# Patient Record
Sex: Female | Born: 1965 | Race: White | Hispanic: No | Marital: Married | Smoking: Never smoker
Health system: Southern US, Community
[De-identification: ages and names within clinical notes are randomized; demographics above are authoritative.]

## PROBLEM LIST (undated history)

## (undated) DIAGNOSIS — C439 Malignant melanoma of skin, unspecified: Secondary | ICD-10-CM

## (undated) DIAGNOSIS — G43909 Migraine, unspecified, not intractable, without status migrainosus: Secondary | ICD-10-CM

## (undated) DIAGNOSIS — I10 Essential (primary) hypertension: Secondary | ICD-10-CM

## (undated) DIAGNOSIS — F411 Generalized anxiety disorder: Secondary | ICD-10-CM

## (undated) DIAGNOSIS — T7840XA Allergy, unspecified, initial encounter: Secondary | ICD-10-CM

## (undated) DIAGNOSIS — R112 Nausea with vomiting, unspecified: Secondary | ICD-10-CM

## (undated) DIAGNOSIS — Z9889 Other specified postprocedural states: Secondary | ICD-10-CM

## (undated) HISTORY — PX: WISDOM TOOTH EXTRACTION: SHX21

## (undated) HISTORY — DX: Other specified postprocedural states: R11.2

## (undated) HISTORY — DX: Other specified postprocedural states: Z98.890

## (undated) HISTORY — DX: Generalized anxiety disorder: F41.1

## (undated) HISTORY — DX: Allergy, unspecified, initial encounter: T78.40XA

## (undated) HISTORY — DX: Migraine, unspecified, not intractable, without status migrainosus: G43.909

## (undated) HISTORY — PX: COLONOSCOPY: SHX174

## (undated) HISTORY — DX: Malignant melanoma of skin, unspecified: C43.9

## (undated) HISTORY — DX: Essential (primary) hypertension: I10

---

## 2003-10-10 ENCOUNTER — Ambulatory Visit (HOSPITAL_COMMUNITY): Admission: RE | Admit: 2003-10-10 | Discharge: 2003-10-10 | Payer: Self-pay | Admitting: Internal Medicine

## 2004-02-14 ENCOUNTER — Other Ambulatory Visit: Admission: RE | Admit: 2004-02-14 | Discharge: 2004-02-14 | Payer: Self-pay | Admitting: Obstetrics and Gynecology

## 2005-03-16 ENCOUNTER — Other Ambulatory Visit: Admission: RE | Admit: 2005-03-16 | Discharge: 2005-03-16 | Payer: Self-pay | Admitting: Obstetrics and Gynecology

## 2005-09-03 ENCOUNTER — Ambulatory Visit: Payer: Self-pay | Admitting: Internal Medicine

## 2005-09-10 ENCOUNTER — Ambulatory Visit: Payer: Self-pay | Admitting: Internal Medicine

## 2005-09-23 ENCOUNTER — Ambulatory Visit: Payer: Self-pay

## 2005-11-11 ENCOUNTER — Encounter: Admission: RE | Admit: 2005-11-11 | Discharge: 2005-11-11 | Payer: Self-pay | Admitting: Orthopedic Surgery

## 2005-12-14 LAB — HM MAMMOGRAPHY

## 2006-09-07 ENCOUNTER — Ambulatory Visit: Payer: Self-pay | Admitting: Internal Medicine

## 2006-09-14 ENCOUNTER — Ambulatory Visit: Payer: Self-pay | Admitting: Internal Medicine

## 2006-12-28 IMAGING — US US RENAL
1 series · 14 of 25 positions shown · non-contrast
Comparison: Report of recent MRI.

CLINICAL DATA: Abnormal findings incidentally noted of the kidneys, on MRI of the lumbar spine. 
 RENAL ULTRASOUND:
TECHNIQUE: Complete ultrasound examination of the urinary tract was performed including evaluation of the kidneys, renal collecting systems, and urinary bladder.

[Series 1: us renal · 0.24mm/px · 14 of 42 slices shown]
[im 1/42]
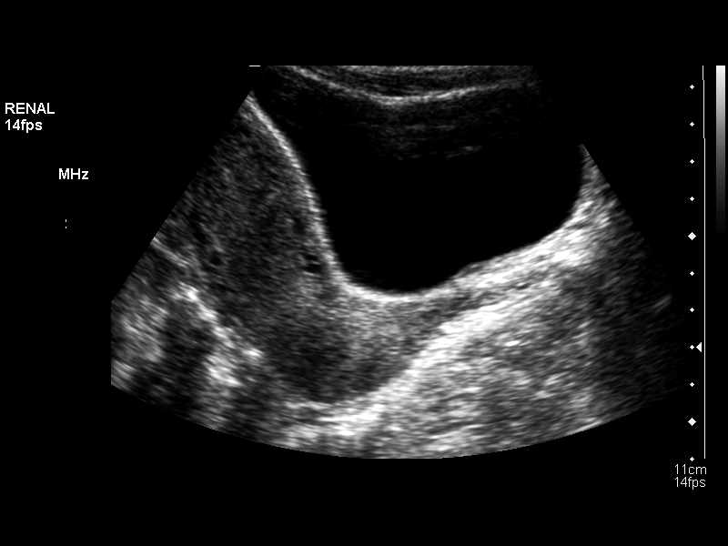
[im 4/42]
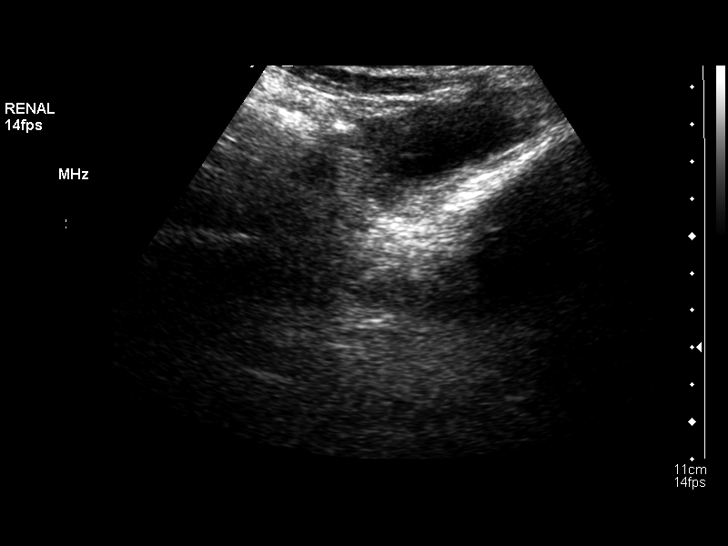
[im 7/42]
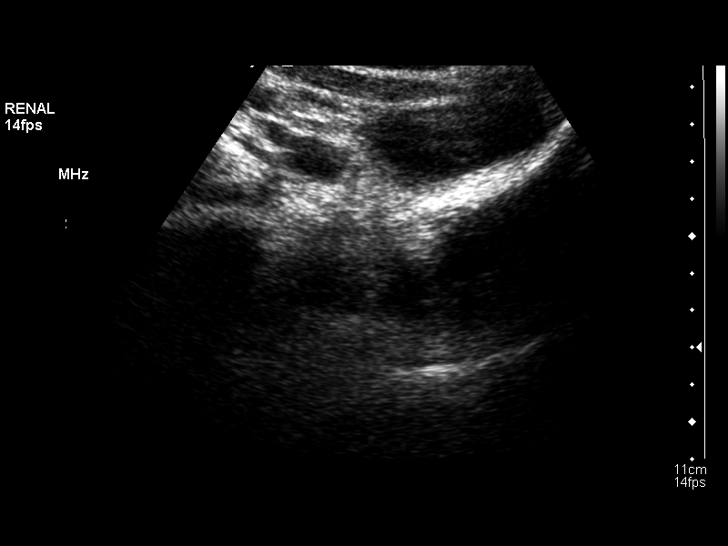
[im 11/42]
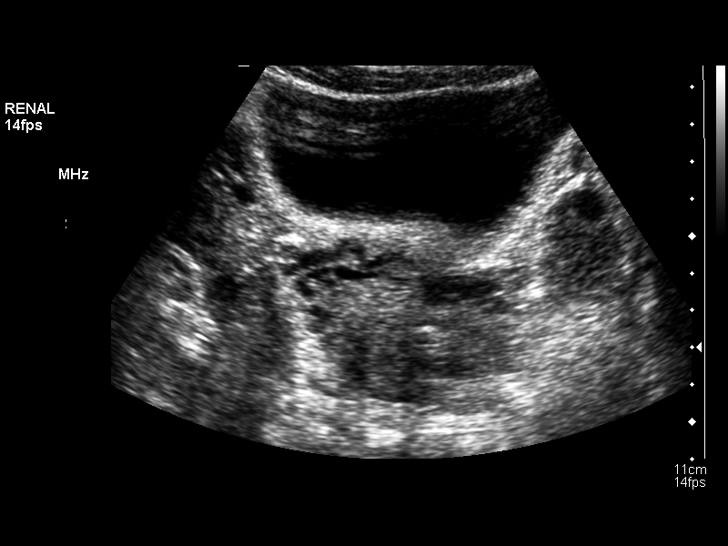
[im 14/42]
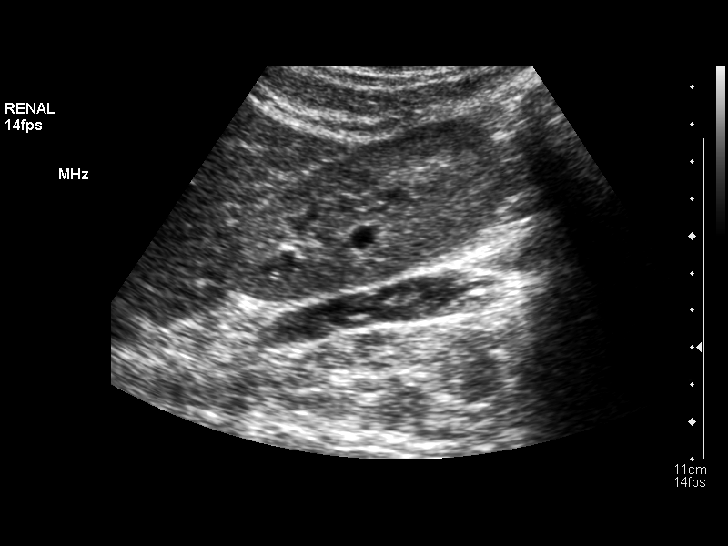
[im 16/42]
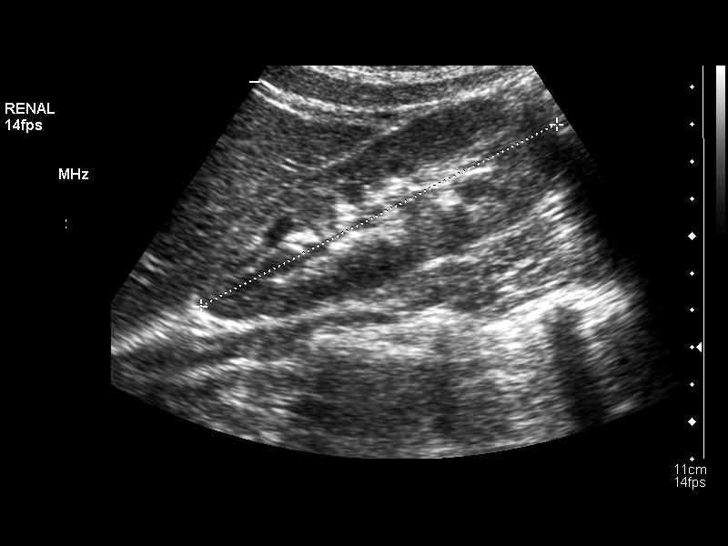
[im 19/42]
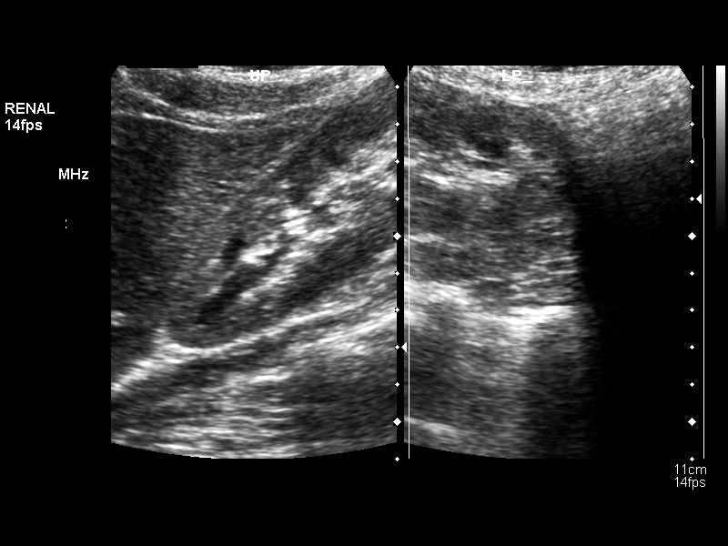
[im 23/42]
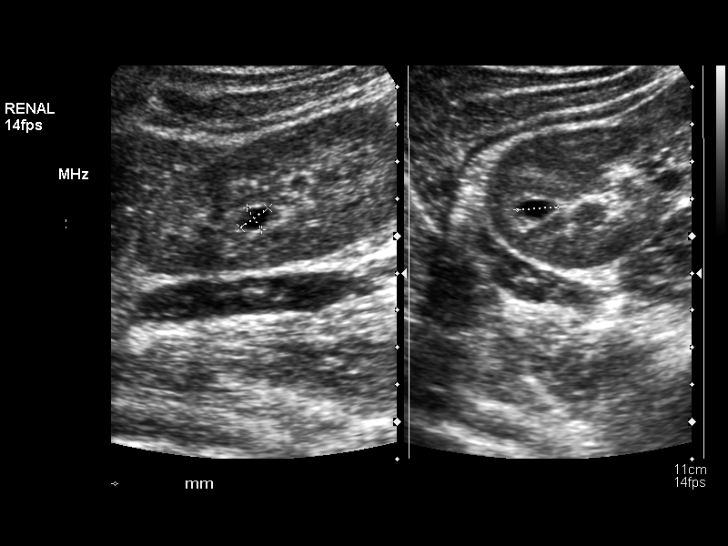
[im 26/42]
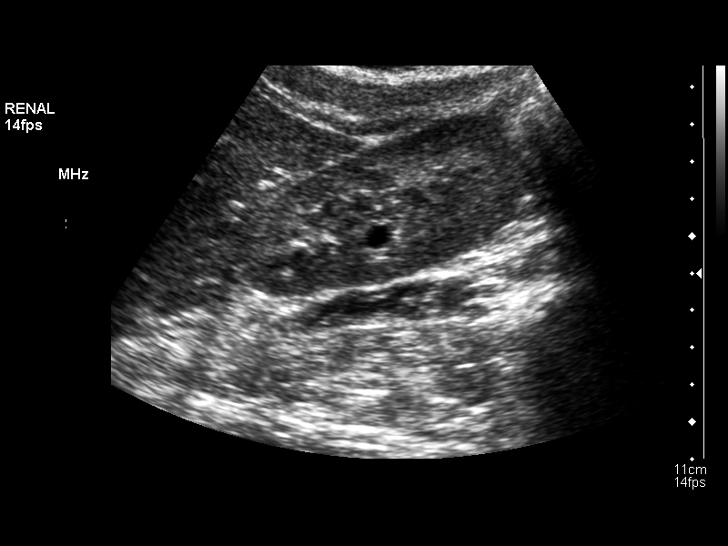
[im 28/42]
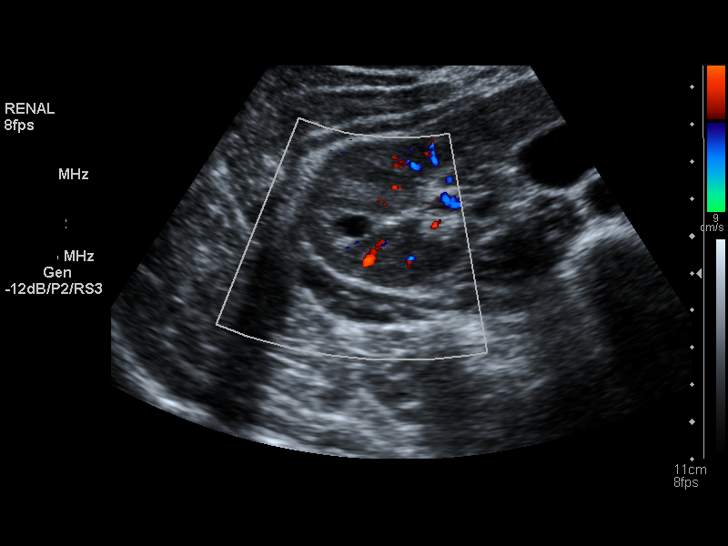
[im 31/42]
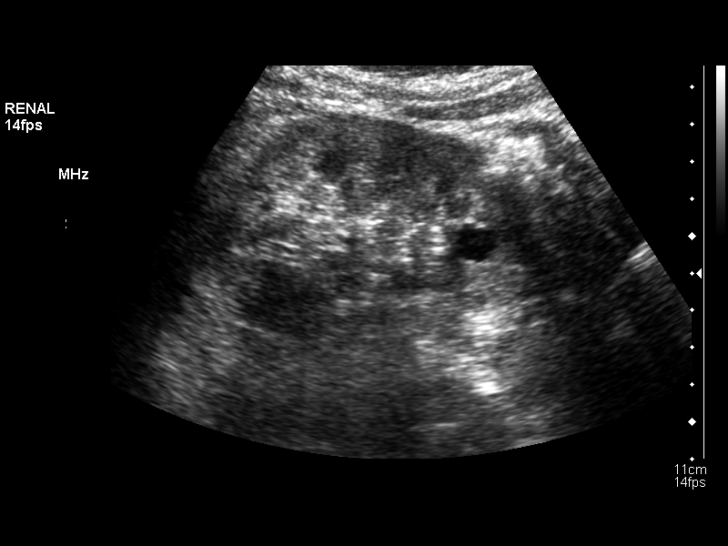
[im 35/42]
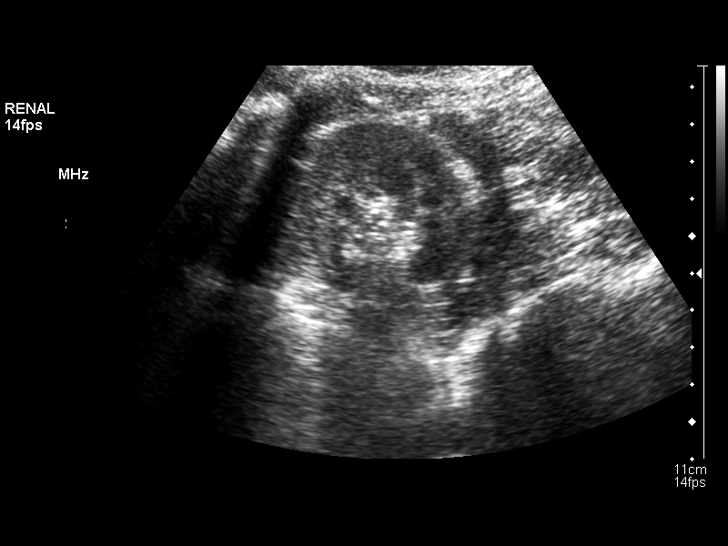
[im 38/42]
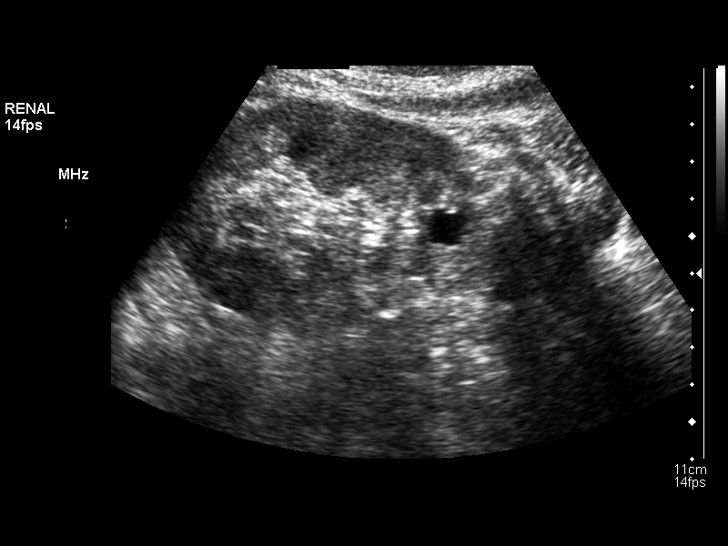
[im 42/42]
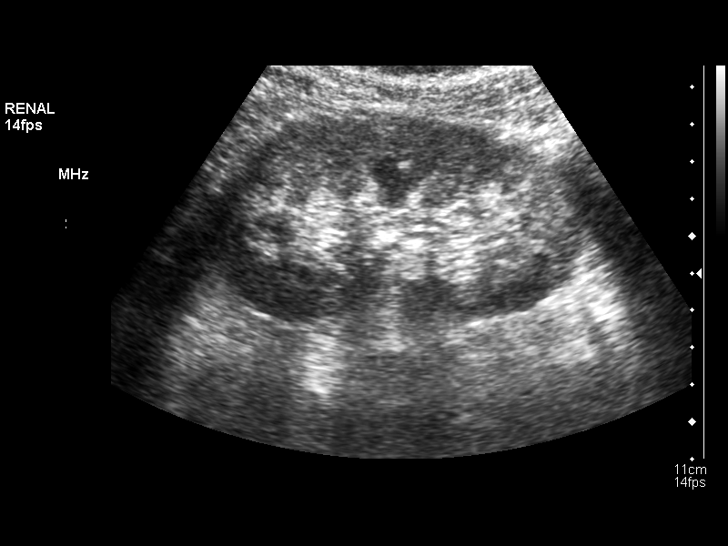

[14 of 25 positions shown; findings below may reference images not displayed]

FINDINGS: The right kidney is 10.8cm and the left 9.9cm.  No hydronephrosis or calculi.  There is an 11mm simple cyst in the lateral mid right kidney.  There is an 11mm simple cyst of the left mid kidney.  No solid masses or complex lesions. 
 The bladder is normal.
IMPRESSION: Small bilateral simple renal cysts ? otherwise normal exam.

## 2007-04-29 ENCOUNTER — Ambulatory Visit: Payer: Self-pay | Admitting: Internal Medicine

## 2007-07-27 DIAGNOSIS — G43909 Migraine, unspecified, not intractable, without status migrainosus: Secondary | ICD-10-CM

## 2007-09-13 ENCOUNTER — Ambulatory Visit: Payer: Self-pay | Admitting: Internal Medicine

## 2007-09-13 LAB — CONVERTED CEMR LAB
ALT: 26 units/L (ref 0–35)
AST: 25 units/L (ref 0–37)
Albumin: 3.9 g/dL (ref 3.5–5.2)
Alkaline Phosphatase: 48 units/L (ref 39–117)
BUN: 13 mg/dL (ref 6–23)
Basophils Absolute: 0 10*3/uL (ref 0.0–0.1)
Basophils Relative: 0.3 % (ref 0.0–1.0)
Bilirubin Urine: NEGATIVE
Bilirubin, Direct: 0.1 mg/dL (ref 0.0–0.3)
Blood in Urine, dipstick: NEGATIVE
CO2: 27 meq/L (ref 19–32)
Calcium: 9.5 mg/dL (ref 8.4–10.5)
Chloride: 103 meq/L (ref 96–112)
Cholesterol: 222 mg/dL (ref 0–200)
Creatinine, Ser: 0.8 mg/dL (ref 0.4–1.2)
Direct LDL: 130.5 mg/dL
Eosinophils Absolute: 0 10*3/uL (ref 0.0–0.6)
Eosinophils Relative: 0.5 % (ref 0.0–5.0)
GFR calc Af Amer: 102 mL/min
GFR calc non Af Amer: 84 mL/min
Glucose, Bld: 84 mg/dL (ref 70–99)
Glucose, Urine, Semiquant: NEGATIVE
HCT: 40 % (ref 36.0–46.0)
HDL: 73.2 mg/dL (ref >39.0)
Hemoglobin: 14 g/dL (ref 12.0–15.0)
Ketones, urine, test strip: NEGATIVE
Lymphocytes Relative: 32.8 % (ref 12.0–46.0)
MCHC: 35.1 g/dL (ref 30.0–36.0)
MCV: 93.7 fL (ref 78.0–100.0)
Monocytes Absolute: 0.3 10*3/uL (ref 0.2–0.7)
Monocytes Relative: 6.5 % (ref 3.0–11.0)
Neutro Abs: 3.1 10*3/uL (ref 1.4–7.7)
Neutrophils Relative %: 59.9 % (ref 43.0–77.0)
Nitrite: NEGATIVE
Platelets: 246 10*3/uL (ref 150–400)
Potassium: 4.6 meq/L (ref 3.5–5.1)
Protein, U semiquant: NEGATIVE
RBC: 4.27 M/uL (ref 3.87–5.11)
RDW: 11.6 % (ref 11.5–14.6)
Sodium: 139 meq/L (ref 135–145)
Specific Gravity, Urine: 1.015
TSH: 1.54 microintl units/mL (ref 0.35–5.50)
Total Bilirubin: 0.8 mg/dL (ref 0.3–1.2)
Total CHOL/HDL Ratio: 3
Total Protein: 6.7 g/dL (ref 6.0–8.3)
Triglycerides: 112 mg/dL (ref 0–149)
Urobilinogen, UA: 0.2
VLDL: 22 mg/dL (ref 0–40)
WBC Urine, dipstick: NEGATIVE
WBC: 5.1 10*3/uL (ref 4.5–10.5)
pH: 7.5

## 2007-09-20 ENCOUNTER — Ambulatory Visit: Payer: Self-pay | Admitting: Internal Medicine

## 2007-10-10 ENCOUNTER — Telehealth: Payer: Self-pay | Admitting: Internal Medicine

## 2007-10-18 ENCOUNTER — Ambulatory Visit: Payer: Self-pay | Admitting: Internal Medicine

## 2007-10-18 DIAGNOSIS — R0989 Other specified symptoms and signs involving the circulatory and respiratory systems: Secondary | ICD-10-CM

## 2007-12-20 ENCOUNTER — Ambulatory Visit: Payer: Self-pay | Admitting: Internal Medicine

## 2007-12-20 DIAGNOSIS — F411 Generalized anxiety disorder: Secondary | ICD-10-CM

## 2007-12-20 HISTORY — DX: Generalized anxiety disorder: F41.1

## 2007-12-21 ENCOUNTER — Ambulatory Visit: Payer: Self-pay | Admitting: Internal Medicine

## 2007-12-27 ENCOUNTER — Telehealth: Payer: Self-pay | Admitting: Internal Medicine

## 2007-12-27 ENCOUNTER — Encounter: Payer: Self-pay | Admitting: Internal Medicine

## 2008-01-03 ENCOUNTER — Ambulatory Visit: Payer: Self-pay | Admitting: Licensed Clinical Social Worker

## 2008-01-10 ENCOUNTER — Ambulatory Visit: Payer: Self-pay | Admitting: Licensed Clinical Social Worker

## 2008-01-18 ENCOUNTER — Ambulatory Visit: Payer: Self-pay | Admitting: Licensed Clinical Social Worker

## 2008-09-25 ENCOUNTER — Telehealth: Payer: Self-pay | Admitting: Internal Medicine

## 2008-09-25 ENCOUNTER — Ambulatory Visit: Payer: Self-pay | Admitting: Internal Medicine

## 2008-10-19 ENCOUNTER — Ambulatory Visit: Payer: Self-pay | Admitting: Internal Medicine

## 2008-11-30 ENCOUNTER — Ambulatory Visit: Payer: Self-pay | Admitting: Internal Medicine

## 2008-11-30 LAB — CONVERTED CEMR LAB
Bilirubin Urine: NEGATIVE
Glucose, Urine, Semiquant: NEGATIVE
Ketones, urine, test strip: NEGATIVE
Nitrite: NEGATIVE
Protein, U semiquant: NEGATIVE
Specific Gravity, Urine: <1.005
Urobilinogen, UA: 0.2
WBC Urine, dipstick: NEGATIVE
pH: 5.5

## 2008-12-01 ENCOUNTER — Encounter: Payer: Self-pay | Admitting: Internal Medicine

## 2008-12-14 DIAGNOSIS — C439 Malignant melanoma of skin, unspecified: Secondary | ICD-10-CM

## 2008-12-14 HISTORY — PX: MELANOMA EXCISION: SHX5266

## 2008-12-14 HISTORY — DX: Malignant melanoma of skin, unspecified: C43.9

## 2009-07-25 ENCOUNTER — Ambulatory Visit: Payer: Self-pay | Admitting: Internal Medicine

## 2009-07-25 LAB — CONVERTED CEMR LAB
ALT: 19 units/L (ref 0–35)
AST: 25 units/L (ref 0–37)
Albumin: 4.6 g/dL (ref 3.5–5.2)
Alkaline Phosphatase: 55 units/L (ref 39–117)
BUN: 17 mg/dL (ref 6–23)
Basophils Absolute: 0 10*3/uL (ref 0.0–0.1)
Basophils Relative: 0.3 % (ref 0.0–3.0)
Bilirubin Urine: NEGATIVE
Bilirubin, Direct: 0.2 mg/dL (ref 0.0–0.3)
CO2: 29 meq/L (ref 19–32)
Calcium: 9.7 mg/dL (ref 8.4–10.5)
Chloride: 104 meq/L (ref 96–112)
Cholesterol: 211 mg/dL — ABNORMAL HIGH (ref 0–200)
Creatinine, Ser: 0.7 mg/dL (ref 0.4–1.2)
Direct LDL: 107.1 mg/dL
Eosinophils Absolute: 0.1 10*3/uL (ref 0.0–0.7)
Eosinophils Relative: 1.4 % (ref 0.0–5.0)
GFR calc non Af Amer: 97.11 mL/min (ref >60)
Glucose, Bld: 81 mg/dL (ref 70–99)
HCT: 46.2 % — ABNORMAL HIGH (ref 36.0–46.0)
HDL: 87.4 mg/dL (ref >39.00)
Hemoglobin, Urine: NEGATIVE
Hemoglobin: 15.5 g/dL — ABNORMAL HIGH (ref 12.0–15.0)
Ketones, ur: NEGATIVE mg/dL
Leukocytes, UA: NEGATIVE
Lymphocytes Relative: 30.7 % (ref 12.0–46.0)
Lymphs Abs: 1.4 10*3/uL (ref 0.7–4.0)
MCHC: 33.5 g/dL (ref 30.0–36.0)
MCV: 95.6 fL (ref 78.0–100.0)
Monocytes Absolute: 0.4 10*3/uL (ref 0.1–1.0)
Monocytes Relative: 9 % (ref 3.0–12.0)
Neutro Abs: 2.7 10*3/uL (ref 1.4–7.7)
Neutrophils Relative %: 58.6 % (ref 43.0–77.0)
Nitrite: NEGATIVE
Platelets: 232 10*3/uL (ref 150.0–400.0)
Potassium: 4.2 meq/L (ref 3.5–5.1)
RBC: 4.84 M/uL (ref 3.87–5.11)
RDW: 12 % (ref 11.5–14.6)
Sodium: 139 meq/L (ref 135–145)
Specific Gravity, Urine: 1.01 (ref 1.000–1.030)
TSH: 1.38 microintl units/mL (ref 0.35–5.50)
Total Bilirubin: 1.1 mg/dL (ref 0.3–1.2)
Total CHOL/HDL Ratio: 2
Total Protein, Urine: NEGATIVE mg/dL
Total Protein: 7.4 g/dL (ref 6.0–8.3)
Triglycerides: 50 mg/dL (ref 0.0–149.0)
Urine Glucose: NEGATIVE mg/dL
Urobilinogen, UA: 0.2 (ref 0.0–1.0)
VLDL: 10 mg/dL (ref 0.0–40.0)
WBC: 4.6 10*3/uL (ref 4.5–10.5)
pH: 6 (ref 5.0–8.0)

## 2009-08-02 ENCOUNTER — Ambulatory Visit: Payer: Self-pay | Admitting: Internal Medicine

## 2009-09-11 ENCOUNTER — Ambulatory Visit: Payer: Self-pay | Admitting: Internal Medicine

## 2010-06-23 ENCOUNTER — Ambulatory Visit: Payer: Self-pay | Admitting: Internal Medicine

## 2010-06-23 DIAGNOSIS — M549 Dorsalgia, unspecified: Secondary | ICD-10-CM | POA: Insufficient documentation

## 2010-09-04 ENCOUNTER — Ambulatory Visit: Payer: Self-pay | Admitting: Internal Medicine

## 2011-01-11 LAB — CONVERTED CEMR LAB
CK-MB: 0.6 ng/mL (ref 0.3–4.0)
TSH: 1.75 microintl units/mL (ref 0.35–5.50)
Total CK: 69 units/L (ref 7–177)

## 2011-01-13 NOTE — Assessment & Plan Note (Signed)
Summary: FLU SHOT // RS  Nurse Visit   Allergies: 1)  ! Celebrex (Celecoxib)  Orders Added: 1)  Admin 1st Vaccine [90471] 2)  Flu Vaccine 16yrs + [04540] Flu Vaccine Consent Questions     Do you have a history of severe allergic reactions to this vaccine? no    Any prior history of allergic reactions to egg and/or gelatin? no    Do you have a sensitivity to the preservative Thimersol? no    Do you have a past history of Guillan-Barre Syndrome? no    Do you currently have an acute febrile illness? no    Have you ever had a severe reaction to latex? no    Vaccine information given and explained to patient? yes    Are you currently pregnant? no    Lot Number:[id number]   Exp Date:06/13/2011   Site Given  Left Deltoid IM .lbflu

## 2011-01-13 NOTE — Assessment & Plan Note (Signed)
Summary: acute back injury/dm   Vital Signs:  Patient profile:   45 year old female Height:      65.5 inches (166.37 cm) Weight:      129.31 pounds (58.78 kg) BMI:     21.27 Temp:     99.0 degrees F (37.22 degrees C) oral Pulse rate:   104 / minute BP sitting:   166 / 90  (left arm) Cuff size:   regular  Vitals Entered By: Josph Macho RMA (June 23, 2010 11:12 AM) CC: Acute Back injury X1 month off and on/ CF, Back pain Is Patient Diabetic? No   CC:  Acute Back injury X1 month off and on/ CF and Back pain.  History of Present Illness:  Back Pain      This is a 45 year old woman who presents with Back pain.  The symptoms began 1 month ago ago.  The patient denies fever, chills, weakness, loss of sensation, fecal incontinence, and urinary incontinence.  The pain is located in the mid low back.  The pain began suddenly.  The pain is made worse by standing or walking and flexion.  The pain is made better by inactivity.  1 month ago pain was intense and sudden---improved until 2 days ago when she had a garage sale----recurrent pain more of a gradual onset---she now describes pain as "tight". no radiation of pain.  All other systems reviewed and were negative   Current Medications (verified): 1)  Meloxicam 7.5 Mg Tabs (Meloxicam) .... Take 1 Tablet By Mouth Once A Day Prn 2)  Imitrex 100 Mg Tabs (Sumatriptan Succinate) .... As Needed  Allergies (verified): 1)  ! Celebrex (Celecoxib)  Physical Exam  Msk:  FROM both hips SLR negative Neurologic:  DTRs lower extremities normal and symmetric   Impression & Recommendations:  Problem # 1:  BACK PAIN (ICD-724.5) trial nsaid muscle relaxer  exercises side effects discussed call if sxs persist Her updated medication list for this problem includes:    Meloxicam 7.5 Mg Tabs (Meloxicam) .[facility]... Take 1 tablet by mouth once a day prn    Cyclobenzaprine Hcl 10 Mg Tabs (Cyclobenzaprine hcl) .Marland Kitchen... 1 by mouth 2 times daily as needed for  back pain  Complete Medication List: 1)  Meloxicam 7.5 Mg Tabs (Meloxicam) .... Take 1 tablet by mouth once a day prn 2)  Imitrex 100 Mg Tabs (Sumatriptan succinate) .... As needed 3)  Cyclobenzaprine Hcl 10 Mg Tabs (Cyclobenzaprine hcl) .Marland Kitchen.. 1 by mouth 2 times daily as needed for back pain Prescriptions: MELOXICAM 7.5 MG TABS (MELOXICAM) Take 1 tablet by mouth once a day prn  #30 Tablet x 0   Entered and Authorized by:   Birdie Sons MD   Signed by:   Birdie Sons MD on 06/23/2010   Method used:   Electronically to        [facility] (retail)       [address]       [place], [place]  [address]       Ph: [phone]       Fax: [phone]   RxID:   [id number] CYCLOBENZAPRINE HCL 10 MG  TABS (CYCLOBENZAPRINE HCL) 1 by mouth 2 times daily as needed for back pain  #30 x 1   Entered and Authorized by:   Birdie Sons MD   Signed by:   Birdie Sons MD on 06/23/2010   Method used:   Electronically to  [facility]       [address]       [place], [place]  [address]       Ph: [phone]       Fax: [phone]   RxID:   [id number]

## 2011-04-01 ENCOUNTER — Telehealth: Payer: Self-pay | Admitting: *Deleted

## 2011-04-01 MED ORDER — HYDROCORTISONE-ACETIC ACID 1-2 % OT SOLN: 3.0000 [drp] | Freq: Three times a day (TID) | OTIC | Status: AC

## 2011-04-01 NOTE — Telephone Encounter (Signed)
See order(s).

## 2011-04-01 NOTE — Telephone Encounter (Signed)
Pt is complaining of one ear canal itching with no rash or pain. Would like ear drops called in to [facility]).

## 2011-04-21 ENCOUNTER — Ambulatory Visit (INDEPENDENT_AMBULATORY_CARE_PROVIDER_SITE_OTHER): Payer: Managed Care, Other (non HMO) | Admitting: Internal Medicine

## 2011-04-21 ENCOUNTER — Encounter: Payer: Self-pay | Admitting: Internal Medicine

## 2011-04-21 VITALS — BP 132/90 | HR 114 | Ht 65.75 in | Wt 122.0 lb

## 2011-04-21 DIAGNOSIS — H9209 Otalgia, unspecified ear: Secondary | ICD-10-CM | POA: Insufficient documentation

## 2011-04-21 MED ORDER — NEOMYCIN-POLYMYXIN-HC 3.5-10000-1 OT SOLN: 3.0000 [drp] | Freq: Three times a day (TID) | OTIC | Status: AC

## 2011-04-21 NOTE — Assessment & Plan Note (Signed)
Given notation of discharge recommend treatment for possible OE. Begin cortisporin otic gtts to left ear. If sx's return after tx consider more likely eczema of canal.

## 2011-04-21 NOTE — Progress Notes (Signed)
  Subjective:    Patient ID: Stacey Burton, female    DOB: 1966-02-17, 45 y.o.   MRN: [id number]  HPI Pt presents to clinic for evaluation of ear discomfort. Notes several week h/o left ear discomfort described as itching. On 4/18 began acetic acid/hydrocortisone combination with subsequent improvement of itching. Did develop temporary discharge described as white and noted transient tinnitus. No injury, trauma, fever or chills. Stopped the medication and the itching sensation has returned. No other alleviating or exacerbating factors. No other complaints.  Reviewed pmh, medications, and allergies.    Review of Systems see HPI     Objective:   Physical Exam  Nursing note and vitals reviewed. Constitutional: She appears well-developed and well-nourished. No distress.  HENT:  Head: Normocephalic and atraumatic.  Right Ear: External ear normal.  Left Ear: External ear normal.  Nose: Nose normal.       Left canal with mild scaling of distal lower canal. No drainage, wound, erythema or swelling noted. TM nl. Right canal partially obscured with cerumen.  Eyes: Conjunctivae are normal. No scleral icterus.  Neurological: She is alert.  Skin: Skin is warm and dry. She is not diaphoretic.          Assessment & Plan:

## 2011-06-30 ENCOUNTER — Ambulatory Visit (INDEPENDENT_AMBULATORY_CARE_PROVIDER_SITE_OTHER): Payer: Managed Care, Other (non HMO) | Admitting: Internal Medicine

## 2011-06-30 ENCOUNTER — Encounter: Payer: Self-pay | Admitting: Internal Medicine

## 2011-06-30 ENCOUNTER — Other Ambulatory Visit: Payer: Self-pay | Admitting: Internal Medicine

## 2011-06-30 ENCOUNTER — Ambulatory Visit: Payer: Managed Care, Other (non HMO) | Admitting: Internal Medicine

## 2011-06-30 VITALS — BP 160/70 | HR 100 | Temp 98.1°F | Wt 122.0 lb

## 2011-06-30 DIAGNOSIS — J029 Acute pharyngitis, unspecified: Secondary | ICD-10-CM

## 2011-06-30 DIAGNOSIS — K122 Cellulitis and abscess of mouth: Secondary | ICD-10-CM

## 2011-06-30 DIAGNOSIS — Z8679 Personal history of other diseases of the circulatory system: Secondary | ICD-10-CM

## 2011-06-30 LAB — POCT RAPID STREP A (OFFICE): Rapid Strep A Screen: NEGATIVE

## 2011-06-30 MED ORDER — AMOXICILLIN-POT CLAVULANATE 875-125 MG PO TABS: 1.0000 | ORAL_TABLET | Freq: Two times a day (BID) | ORAL | Status: AC

## 2011-06-30 NOTE — Patient Instructions (Signed)
Will notify you  of labs/ culture  when available. Treat for bacterial uvulitis. If expect improvement in the next 48 hours or so . Call if worse or cant swallow or high fever.

## 2011-06-30 NOTE — Progress Notes (Signed)
  Subjective:    Patient ID: Stacey Burton, female    DOB: 09-25-1966, 45 y.o.   MRN: [id number]  HPI Patient comes in today for an acute visit. Sx for 2 days scratchy sore throat and then developed more severe throat pain making it hard to sleep. She is awoken with swelling and her uvula. No significant cough drooling or fever.  No fever cough or congestion.  Some neck soreness.   Took advil  2 -- 4 am with minimal help. She has not had a sore throat like this before. Usual rashes no other treatment. Review of Systems No fever but feels bad no nvd  Remote hx of strep  9 years ago   No exposures recently    She has labile hypertension with whitecoat phenomenon and has had a normal blood pressure in other situations. This is been addressed by Dr. Cato Mulligan in the past. No cardiovascular symptoms.  Past history family history social history reviewed in the electronic medical record.  Objective:   Physical Exam WDWN in nad but speech slightly full HEENT: Normocephalic ;atraumatic , Eyes;  PERRL, EOMs  Full, lids and conjunctiva clear,,Ears: no deformities, canals nl, TM landmarks normal, Nose: no deformity or discharge  Mouth : OP very red and swollen  Uvula bright red with edema aiway is good.,   No ulcers Neck no adenopathy but some tenderness at  Ac areas.  Abdomen:  Sof,t normal bowel sounds without hepatosplenomegaly, no guarding rebound or masses no CVA tenderness Skin no acute rash or petechiae  .     RS neg  Culture taken Assessment & Plan:  Acute pharyngitis with uvulitis and edema Empiric antibiotic  rx and culture pending  Close  F/u and visit if not better .    Labile HT  White coat phenomenon addressed  Fu per Dr Cato Mulligan as needed. Has a strong family history of hypertension also.

## 2011-07-01 ENCOUNTER — Telehealth: Payer: Self-pay

## 2011-07-01 NOTE — Telephone Encounter (Signed)
Pt notes that her throat feels much better today but generally she feels a little worse and has a very low grade fever of 99.0.  Advised pt to continue abx as prescribed and if not feeling better but the completion of abx, then she should call office

## 2011-07-02 LAB — CULTURE, GROUP A STREP: Organism ID, Bacteria: NORMAL

## 2011-07-03 NOTE — Progress Notes (Signed)
Left message with husband about results and he said that she is doing much better.

## 2011-09-09 ENCOUNTER — Other Ambulatory Visit (INDEPENDENT_AMBULATORY_CARE_PROVIDER_SITE_OTHER): Payer: Managed Care, Other (non HMO)

## 2011-09-09 DIAGNOSIS — Z0000 Encounter for general adult medical examination without abnormal findings: Secondary | ICD-10-CM

## 2011-09-09 LAB — CBC WITH DIFFERENTIAL/PLATELET
Basophils Absolute: 0 10*3/uL (ref 0.0–0.1)
Basophils Relative: 0.8 % (ref 0.0–3.0)
Eosinophils Absolute: 0 10*3/uL (ref 0.0–0.7)
Eosinophils Relative: 1 % (ref 0.0–5.0)
HCT: 44.4 % (ref 36.0–46.0)
Hemoglobin: 14.7 g/dL (ref 12.0–15.0)
Lymphocytes Relative: 31.5 % (ref 12.0–46.0)
Lymphs Abs: 1.5 10*3/uL (ref 0.7–4.0)
MCHC: 33.2 g/dL (ref 30.0–36.0)
MCV: 96.2 fl (ref 78.0–100.0)
Monocytes Absolute: 0.4 10*3/uL (ref 0.1–1.0)
Monocytes Relative: 7.7 % (ref 3.0–12.0)
Neutro Abs: 2.7 10*3/uL (ref 1.4–7.7)
Neutrophils Relative %: 59 % (ref 43.0–77.0)
Platelets: 247 10*3/uL (ref 150.0–400.0)
RBC: 4.62 Mil/uL (ref 3.87–5.11)
RDW: 12.9 % (ref 11.5–14.6)
WBC: 4.7 10*3/uL (ref 4.5–10.5)

## 2011-09-09 LAB — LIPID PANEL
Cholesterol: 186 mg/dL (ref 0–200)
HDL: 81.6 mg/dL (ref >39.00)
LDL Cholesterol: 94 mg/dL (ref 0–99)
Total CHOL/HDL Ratio: 2
Triglycerides: 53 mg/dL (ref 0.0–149.0)
VLDL: 10.6 mg/dL (ref 0.0–40.0)

## 2011-09-09 LAB — POCT URINALYSIS DIPSTICK
Bilirubin, UA: NEGATIVE
Blood, UA: NEGATIVE
Glucose, UA: NEGATIVE
Ketones, UA: NEGATIVE
Leukocytes, UA: NEGATIVE
Nitrite, UA: NEGATIVE
Protein, UA: NEGATIVE
Spec Grav, UA: 1.02
Urobilinogen, UA: 0.2

## 2011-09-09 LAB — BASIC METABOLIC PANEL
BUN: 17 mg/dL (ref 6–23)
CO2: 27 mEq/L (ref 19–32)
Calcium: 9.5 mg/dL (ref 8.4–10.5)
Chloride: 105 mEq/L (ref 96–112)
Creatinine, Ser: 0.8 mg/dL (ref 0.4–1.2)
GFR: 84.87 mL/min (ref >60.00)
Glucose, Bld: 78 mg/dL (ref 70–99)
Potassium: 4.9 mEq/L (ref 3.5–5.1)
Sodium: 140 mEq/L (ref 135–145)

## 2011-09-09 LAB — TSH: TSH: 0.75 u[IU]/mL (ref 0.35–5.50)

## 2011-09-09 LAB — HEPATIC FUNCTION PANEL
ALT: 13 U/L (ref 0–35)
AST: 19 U/L (ref 0–37)
Albumin: 4.5 g/dL (ref 3.5–5.2)
Alkaline Phosphatase: 49 U/L (ref 39–117)
Bilirubin, Direct: 0 mg/dL (ref 0.0–0.3)
Total Bilirubin: 0.9 mg/dL (ref 0.3–1.2)
Total Protein: 7.5 g/dL (ref 6.0–8.3)

## 2011-09-16 ENCOUNTER — Encounter: Payer: Self-pay | Admitting: Internal Medicine

## 2011-09-16 ENCOUNTER — Ambulatory Visit (INDEPENDENT_AMBULATORY_CARE_PROVIDER_SITE_OTHER): Payer: Managed Care, Other (non HMO) | Admitting: Internal Medicine

## 2011-09-16 VITALS — BP 156/84 | HR 114 | Temp 98.6°F | Ht 65.5 in | Wt 121.0 lb

## 2011-09-16 DIAGNOSIS — Z0000 Encounter for general adult medical examination without abnormal findings: Secondary | ICD-10-CM

## 2011-09-16 DIAGNOSIS — Z23 Encounter for immunization: Secondary | ICD-10-CM

## 2011-09-16 MED ORDER — GLYCOPYRROLATE 2 MG PO TABS: 2.0000 mg | ORAL_TABLET | Freq: Two times a day (BID) | ORAL | Status: DC | PRN

## 2011-09-16 NOTE — Progress Notes (Signed)
  Subjective:    Patient ID: Stacey Burton, female    DOB: 1966-02-06, 45 y.o.   MRN: [id number]  HPI Patient is here for physical exam.   Past Medical History  Diagnosis Date  . Migraines   . Melanoma   . Melanoma 2010   Past Surgical History  Procedure Date  . Melanoma excision 2010    back    reports that she has never smoked. She does not have any smokeless tobacco history on file. She reports that she drinks about .6 ounces of alcohol per week. Her drug history not on file. family history includes Ankylosing spondylitis in her father; Cancer in her mother; and Hypertension in her father and mother. Allergies  Allergen Reactions  . Celecoxib     REACTION: itching      Review of Systems  patient denies chest pain, shortness of breath, orthopnea. Denies lower extremity edema, abdominal pain, change in appetite, change in bowel movements. Patient denies rashes, musculoskeletal complaints. No other specific complaints in a complete review of systems.      Objective:   Physical Exam   Well-developed well-nourished female in no acute distress. HEENT exam atraumatic, normocephalic, extraocular muscles are intact. Neck is supple. No jugular venous distention no thyromegaly. Chest clear to auscultation without increased work of breathing. Cardiac exam S1 and S2 are regular. Abdominal exam active bowel sounds, soft, nontender. Extremities no edema. Neurologic exam she is alert without any motor sensory deficits. Gait is normal.       Assessment & Plan:  Well visit. Health maintenance up-to-date.  She has had irritable bowel symptoms for years. Has used Robinul forte in the past. Prescription written.

## 2012-10-04 ENCOUNTER — Other Ambulatory Visit (INDEPENDENT_AMBULATORY_CARE_PROVIDER_SITE_OTHER): Payer: Managed Care, Other (non HMO)

## 2012-10-04 DIAGNOSIS — Z23 Encounter for immunization: Secondary | ICD-10-CM

## 2012-10-04 DIAGNOSIS — Z0000 Encounter for general adult medical examination without abnormal findings: Secondary | ICD-10-CM

## 2012-10-04 LAB — BASIC METABOLIC PANEL
BUN: 18 mg/dL (ref 6–23)
CO2: 30 mEq/L (ref 19–32)
Calcium: 9.1 mg/dL (ref 8.4–10.5)
Chloride: 104 mEq/L (ref 96–112)
Creatinine, Ser: 0.7 mg/dL (ref 0.4–1.2)
GFR: 89.76 mL/min (ref >60.00)
Glucose, Bld: 78 mg/dL (ref 70–99)
Potassium: 4 mEq/L (ref 3.5–5.1)
Sodium: 138 mEq/L (ref 135–145)

## 2012-10-04 LAB — HEPATIC FUNCTION PANEL
ALT: 12 U/L (ref 0–35)
AST: 18 U/L (ref 0–37)
Albumin: 4.1 g/dL (ref 3.5–5.2)
Alkaline Phosphatase: 44 U/L (ref 39–117)
Bilirubin, Direct: 0.1 mg/dL (ref 0.0–0.3)
Total Bilirubin: 0.6 mg/dL (ref 0.3–1.2)
Total Protein: 7 g/dL (ref 6.0–8.3)

## 2012-10-04 LAB — CBC WITH DIFFERENTIAL/PLATELET
Basophils Absolute: 0 10*3/uL (ref 0.0–0.1)
Basophils Relative: 0.7 % (ref 0.0–3.0)
Eosinophils Absolute: 0 10*3/uL (ref 0.0–0.7)
Eosinophils Relative: 0.9 % (ref 0.0–5.0)
HCT: 43.5 % (ref 36.0–46.0)
Hemoglobin: 14.6 g/dL (ref 12.0–15.0)
Lymphocytes Relative: 33.8 % (ref 12.0–46.0)
Lymphs Abs: 1.4 10*3/uL (ref 0.7–4.0)
MCHC: 33.6 g/dL (ref 30.0–36.0)
MCV: 95.1 fl (ref 78.0–100.0)
Monocytes Absolute: 0.3 10*3/uL (ref 0.1–1.0)
Monocytes Relative: 7.5 % (ref 3.0–12.0)
Neutro Abs: 2.3 10*3/uL (ref 1.4–7.7)
Neutrophils Relative %: 57.1 % (ref 43.0–77.0)
Platelets: 238 10*3/uL (ref 150.0–400.0)
RBC: 4.58 Mil/uL (ref 3.87–5.11)
RDW: 12.8 % (ref 11.5–14.6)
WBC: 4.1 10*3/uL — ABNORMAL LOW (ref 4.5–10.5)

## 2012-10-04 LAB — LIPID PANEL
Cholesterol: 196 mg/dL (ref 0–200)
HDL: 73.2 mg/dL (ref >39.00)
LDL Cholesterol: 114 mg/dL — ABNORMAL HIGH (ref 0–99)
Total CHOL/HDL Ratio: 3
Triglycerides: 46 mg/dL (ref 0.0–149.0)
VLDL: 9.2 mg/dL (ref 0.0–40.0)

## 2012-10-04 LAB — POCT URINALYSIS DIPSTICK
Bilirubin, UA: NEGATIVE
Glucose, UA: NEGATIVE
Ketones, UA: NEGATIVE
Leukocytes, UA: NEGATIVE
Nitrite, UA: NEGATIVE
Protein, UA: NEGATIVE
Spec Grav, UA: 1.01
Urobilinogen, UA: 0.2
pH, UA: 5.5

## 2012-10-06 LAB — TSH: TSH: 1.31 u[IU]/mL (ref 0.35–5.50)

## 2012-10-11 ENCOUNTER — Ambulatory Visit (INDEPENDENT_AMBULATORY_CARE_PROVIDER_SITE_OTHER): Payer: Managed Care, Other (non HMO) | Admitting: Internal Medicine

## 2012-10-11 ENCOUNTER — Encounter: Payer: Self-pay | Admitting: Internal Medicine

## 2012-10-11 VITALS — BP 142/92 | HR 96 | Temp 98.3°F | Ht 65.75 in | Wt 125.0 lb

## 2012-10-11 DIAGNOSIS — Z0000 Encounter for general adult medical examination without abnormal findings: Secondary | ICD-10-CM

## 2012-10-11 NOTE — Progress Notes (Signed)
Patient ID: Stacey Burton, female   DOB: Jun 06, 1966, 46 y.o.   MRN: [id number] cpx  home bps 110/70 average. Past Medical History  Diagnosis Date  . Migraines   . Melanoma   . Melanoma 2010    History   Social History  . Marital Status: Married    Spouse Name: N/A    Number of Children: N/A  . Years of Education: N/A   Occupational History  . Not on file.   Social History Main Topics  . Smoking status: Never Smoker   . Smokeless tobacco: Not on file  . Alcohol Use: 0.6 oz/week    1 Glasses of wine per week  . Drug Use:   . Sexually Active:    Other Topics Concern  . Not on file   Social History Narrative  . No narrative on file    Past Surgical History  Procedure Date  . Melanoma excision 2010    back    Family History  Problem Relation Age of Onset  . Cancer Mother     breast, skin  . Hypertension Mother   . Hypertension Father   . Ankylosing spondylitis Father     Allergies  Allergen Reactions  . Celecoxib     REACTION: itching    Current Outpatient Prescriptions on File Prior to Visit  Medication Sig Dispense Refill  . ibuprofen (ADVIL,MOTRIN) 200 MG tablet Take 200 mg by mouth every 6 (six) hours as needed.        . SUMAtriptan (IMITREX) 100 MG tablet Take 100 mg by mouth every 2 (two) hours as needed.           patient denies chest pain, shortness of breath, orthopnea. Denies lower extremity edema, abdominal pain, change in appetite, change in bowel movements. Patient denies rashes, musculoskeletal complaints. No other specific complaints in a complete review of systems.   BP 142/92  Pulse 96  Temp 98.3 F (36.8 C) (Oral)  Ht 5' 5.75" (1.67 m)  Wt 125 lb (56.7 kg)  BMI 20.33 kg/m2  Well-developed well-nourished female in no acute distress. HEENT exam atraumatic, normocephalic, extraocular muscles are intact. Neck is supple. No jugular venous distention no thyromegaly. Chest clear to auscultation without increased work of breathing.  Cardiac exam S1 and S2 are regular. Abdominal exam active bowel sounds, soft, nontender. Extremities no edema. Neurologic exam she is alert without any motor sensory deficits. Gait is normal.   A/p- well visit- health maint UTD Note hematuria (was having menstrual cycle)

## 2012-11-28 ENCOUNTER — Telehealth: Payer: Self-pay | Admitting: Internal Medicine

## 2012-11-28 NOTE — Telephone Encounter (Signed)
No follow up needed at this time.  Pt will call back if worse.

## 2012-11-28 NOTE — Telephone Encounter (Signed)
Patient Information:  Caller Name: Selena Batten  Phone: [phone]  Patient: Stacey Burton, Stacey Burton  Gender: Female  DOB: 07/23/1966  Age: 46 Years  PCP: Birdie Sons (Adults only)  Pregnant: No  Office Follow Up:  Does the office need to follow up with this patient?: No  Instructions For The Office: N/A   Symptoms  Reason For Call & Symptoms: Has had hoarseness since 12/13 with a productive cough.  No fever.  The sputum is creamy colored.  Reviewed Health History In EMR: Yes  Reviewed Medications In EMR: Yes  Reviewed Allergies In EMR: Yes  Reviewed Surgeries / Procedures: Yes  Date of Onset of Symptoms: 11/25/2012  Treatments Tried: Mucinex, Advill  Treatments Tried Worked: No OB:  LMP: 10/25/2012  Guideline(s) Used:  Cough  Disposition Per Guideline:   Home Care  Reason For Disposition Reached:   Cough with cold symptoms (e.g., runny nose, postnasal drip, throat clearing)  Advice Given:  OTC Cough Syrup - Dextromethorphan:  Cough syrups containing the cough suppressant dextromethorphan (DM) may help decrease your cough. Cough syrups work best for coughs that keep you awake at night. They can also sometimes help in the late stages of a respiratory infection when the cough is dry and hacking. They can be used along with cough drops.  Cough Medicines:  OTC Cough Drops: Cough drops can help a lot, especially for mild coughs. They reduce coughing by soothing your irritated throat and removing that tickle sensation in the back of the throat. Cough drops also have the advantage of portability - you can carry them with you.  Home Remedy - Hard Candy: Hard candy works just as well as medicine-flavored OTC cough drops.   Home Remedy - Honey: This old home remedy has been shown to help decrease coughing at night. The adult dosage is 2 teaspoons (10 ml) at bedtime.  Call Back If:  You become worse

## 2012-11-30 ENCOUNTER — Encounter: Payer: Self-pay | Admitting: Family

## 2012-11-30 ENCOUNTER — Ambulatory Visit (INDEPENDENT_AMBULATORY_CARE_PROVIDER_SITE_OTHER): Payer: Managed Care, Other (non HMO) | Admitting: Family

## 2012-11-30 VITALS — BP 120/80 | Temp 98.7°F | Wt 126.0 lb

## 2012-11-30 DIAGNOSIS — H6120 Impacted cerumen, unspecified ear: Secondary | ICD-10-CM

## 2012-11-30 DIAGNOSIS — H9209 Otalgia, unspecified ear: Secondary | ICD-10-CM

## 2012-11-30 DIAGNOSIS — J069 Acute upper respiratory infection, unspecified: Secondary | ICD-10-CM

## 2012-11-30 MED ORDER — GUAIFENESIN-CODEINE 100-10 MG/5ML PO SYRP: 5.0000 mL | ORAL_SOLUTION | Freq: Three times a day (TID) | ORAL | Status: DC | PRN

## 2012-11-30 MED ORDER — METHYLPREDNISOLONE 4 MG PO KIT: PACK | ORAL | Status: AC

## 2012-11-30 NOTE — Patient Instructions (Signed)

## 2012-11-30 NOTE — Progress Notes (Signed)
Subjective:    Patient ID: Stacey Burton, female    DOB: 1966-05-22, 46 y.o.   MRN: [id number]  HPI 46 year old white female, nonsmoker, patient of Dr. Cato Mulligan is in today with complaints of sore throat, laryngitis, cough, congestion, and ear pain x6 days. She's been taken over-the-counter Mucinex and Advil that helps but does not relieve her symptoms. Denies any fever or muscle aches.   Review of Systems  Constitutional: Positive for fatigue.  HENT: Positive for ear pain, congestion, sore throat, rhinorrhea and postnasal drip.   Eyes: Negative.   Respiratory: Positive for cough. Negative for shortness of breath and wheezing.   Cardiovascular: Negative.   Gastrointestinal: Negative.   Musculoskeletal: Negative.   Skin: Negative.   Neurological: Negative.   Hematological: Negative.   Psychiatric/Behavioral: Negative.    Past Medical History  Diagnosis Date  . Migraines   . Melanoma   . Melanoma 2010    History   Social History  . Marital Status: Married    Spouse Name: N/A    Number of Children: N/A  . Years of Education: N/A   Occupational History  . Not on file.   Social History Main Topics  . Smoking status: Never Smoker   . Smokeless tobacco: Not on file  . Alcohol Use: 0.6 oz/week    1 Glasses of wine per week  . Drug Use:   . Sexually Active:    Other Topics Concern  . Not on file   Social History Narrative  . No narrative on file    Past Surgical History  Procedure Date  . Melanoma excision 2010    back    Family History  Problem Relation Age of Onset  . Cancer Mother     breast, skin  . Hypertension Mother   . Hypertension Father   . Ankylosing spondylitis Father     Allergies  Allergen Reactions  . Celecoxib     REACTION: itching    Current Outpatient Prescriptions on File Prior to Visit  Medication Sig Dispense Refill  . ibuprofen (ADVIL,MOTRIN) 200 MG tablet Take 200 mg by mouth every 6 (six) hours as needed.        .  SUMAtriptan (IMITREX) 100 MG tablet Take 100 mg by mouth every 2 (two) hours as needed.          BP 120/80  Temp 98.7 F (37.1 C) (Oral)  Wt 126 lb (57.153 kg)chart     Objective:   Physical Exam  Constitutional: She is oriented to person, place, and time. She appears well-developed and well-nourished.  HENT:  Right Ear: External ear normal.  Left Ear: External ear normal.  Nose: Nose normal.  Mouth/Throat: Oropharynx is clear and moist.  Neck: Normal range of motion. Neck supple.  Cardiovascular: Normal rate, regular rhythm and normal heart sounds.   Pulmonary/Chest: Effort normal and breath sounds normal.  Abdominal: Soft.  Neurological: She is alert and oriented to person, place, and time.  Skin: Skin is warm and dry.  Psychiatric: She has a normal mood and affect.     Informed consent was obtained and peroxide gel was inserted into the ears bilaterally using the lavage kit the ears were lavaged until clean.Inspection with a cerumen spoon removed residual wax. Patient tolerated the procedure well.      Assessment & Plan:  Assessment: Upper respiratory infection, cerumen impaction, otalgia  Plan: Medrol Dosepak as directed. Robitussin with codeine as needed for cough. Rest. Drink plenty of fluids. Patient call  the office if symptoms worsen or persist. Recheck a schedule, and as needed.

## 2012-12-02 ENCOUNTER — Telehealth: Payer: Self-pay | Admitting: Internal Medicine

## 2012-12-02 ENCOUNTER — Encounter: Payer: Self-pay | Admitting: Family Medicine

## 2012-12-02 ENCOUNTER — Ambulatory Visit (INDEPENDENT_AMBULATORY_CARE_PROVIDER_SITE_OTHER): Payer: Managed Care, Other (non HMO) | Admitting: Family Medicine

## 2012-12-02 VITALS — BP 120/84 | Temp 98.0°F | Wt 125.0 lb

## 2012-12-02 DIAGNOSIS — R05 Cough: Secondary | ICD-10-CM

## 2012-12-02 MED ORDER — AZITHROMYCIN 250 MG PO TABS: ORAL_TABLET | ORAL | Status: DC

## 2012-12-02 MED ORDER — BENZONATATE 200 MG PO CAPS: 200.0000 mg | ORAL_CAPSULE | Freq: Three times a day (TID) | ORAL | Status: DC | PRN

## 2012-12-02 NOTE — Patient Instructions (Addendum)
Follow up for any fever or increased shortness of breath. 

## 2012-12-02 NOTE — Telephone Encounter (Signed)
abx sent to pharmacy

## 2012-12-02 NOTE — Telephone Encounter (Signed)
Patient Information:  Caller Name: Selena Batten  Phone: [phone]  Patient: Stacey Burton, Stacey Burton  Gender: Female  DOB: March 09, 1966  Age: 46 Years  PCP: Birdie Sons (Adults only)  Pregnant: No  Office Follow Up:  Does the office need to follow up with this patient?: No  Instructions For The Office: N/A   Symptoms  Reason For Call & Symptoms: Was seen in office 11/30/12 by Delena Bali and said was upper respiratory.  Was prescribed steriods.  She is still having coughing.  Coughing up small amount of pale yellow mucus.  Pt concerned because not feeling better and the holidays are coming up.  Reviewed Health History In EMR: Yes  Reviewed Medications In EMR: Yes  Reviewed Allergies In EMR: Yes  Reviewed Surgeries / Procedures: Yes  Date of Onset of Symptoms: 11/25/2012  Treatments Tried: Prednisone, Muccinex  Treatments Tried Worked: Yes OB / GYN:  LMP: 10/26/2012  Guideline(s) Used:  Cough  Disposition Per Guideline:   See Today or Tomorrow in Office  Reason For Disposition Reached:   Continuous (nonstop) coughing interferes with work or school and no improvement using cough treatment per Care Advice  Advice Given:  N/A  Appointment Scheduled:  12/02/2012 15:30:00 Appointment Scheduled Provider:  Evelena Peat (Family Practice) (no appts available with Dr. Cato Mulligan).

## 2012-12-02 NOTE — Telephone Encounter (Signed)
Seen Padonda for this.

## 2012-12-02 NOTE — Progress Notes (Signed)
  Subjective:    Patient ID: Stacey Burton, female    DOB: 28-Nov-1966, 46 y.o.   MRN: [id number]  HPI  Persistent cough. Onset several days ago with sore throat, laryngitis, nasal congestion and ear pain. Prescribed prednisone. Prednisone has helped relieve her nasal congestion but she's had some persistent mostly dry cough. No fever. She never filled cough medicine. Cough especially bothersome at night. Left chest wall pain which she thinks is due to coughing. She was concerned about prior side effect with codeine years ago with some confusion. Nonsmoker.   Review of Systems  Constitutional: Negative for fever and chills.  HENT: Positive for congestion.   Respiratory: Positive for cough. Negative for shortness of breath and wheezing.   Neurological: Negative for dizziness and headaches.       Objective:   Physical Exam  Constitutional: She appears well-developed and well-nourished.  HENT:  Right Ear: External ear normal.  Left Ear: External ear normal.  Cardiovascular: Normal rate and regular rhythm.   Pulmonary/Chest: Effort normal and breath sounds normal. No respiratory distress. She has no wheezes. She has no rales.          Assessment & Plan:  Persistent cough. Nonfocal exam. Suspect viral. No exam findings or fever to suggest pneumonia. She has tachycardia very likely related to prednisone. Discontinue prednisone. Tessalon Perles 200 mg every 8 hours as needed for cough.

## 2013-08-28 ENCOUNTER — Telehealth: Payer: Self-pay | Admitting: Internal Medicine

## 2013-08-28 NOTE — Telephone Encounter (Signed)
Patient Information:  Caller Name: Katheline  Phone: [phone]  Patient: Stacey Burton, Stacey Burton  Gender: Female  DOB: July 01, 1966  Age: 47 Years  PCP: Birdie Sons (Adults only)  Pregnant: No  Office Follow Up:  Does the office need to follow up with this patient?: No  Instructions For The Office: N/A  RN Note:  Already noted improvement to itching and edema at sting site since took morning dose of Allegra.    Symptoms  Reason For Call & Symptoms: Stung one by suspected hornet on medial aspect of right arm above elbow; Concerned about > swelling since sting 2200 08/26/13.  Reviewed Health History In EMR: Yes  Reviewed Medications In EMR: Yes  Reviewed Allergies In EMR: Yes  Reviewed Surgeries / Procedures: Yes  Date of Onset of Symptoms: 08/26/2013  Treatments Tried: Allegra and Advil  Treatments Tried Worked: Yes OB / GYN:  LMP: 08/14/2013  Guideline(s) Used:  Insect Bite  Bee Sting  Disposition Per Guideline:   Home Care  Reason For Disposition Reached:   Normal local reaction to bee, wasp, or yellow jacket sting  Advice Given:  Pain Medicines:  For pain relief, you can take either acetaminophen, ibuprofen, or naproxen.  Expected Course:  Pain: Severe pain or burning at the site lasts 1 to 2 hours. Pain after this period is usually minimal. Itching often follows the pain.  Redness and Swelling: Normal redness and swelling from the venom can increase for 24 hours following the sting. Redness at the sting site is normal. It doesn't mean that it is infected. The redness can last 3 days and the swelling 7 days.  Stings only rarely get infected.  Antihistamine Medication for Itching:  If the sting becomes very itchy, you can take diphenhydramine (e.g., Benadryl). The adult dosage 25-50 mg by mouth every 6 hours on an as needed basis.  Apply Cold to the Area for Pain - Cold Pack Method:  Apply this cold pack to the area of the sting for 10-20 minutes.  Call Back If:  Difficulty breathing or swallowing (generally develops within the first 2 hours after the sting; call 911)  Swelling becomes huge  Sting begins to look infected  You become worse.  Patient Will Follow Care Advice:  YES

## 2013-09-06 ENCOUNTER — Ambulatory Visit (INDEPENDENT_AMBULATORY_CARE_PROVIDER_SITE_OTHER): Payer: Managed Care, Other (non HMO)

## 2013-09-06 DIAGNOSIS — Z23 Encounter for immunization: Secondary | ICD-10-CM

## 2013-12-14 LAB — HM PAP SMEAR

## 2013-12-14 LAB — HM MAMMOGRAPHY

## 2013-12-20 ENCOUNTER — Other Ambulatory Visit (INDEPENDENT_AMBULATORY_CARE_PROVIDER_SITE_OTHER): Payer: Managed Care, Other (non HMO)

## 2013-12-20 DIAGNOSIS — Z0000 Encounter for general adult medical examination without abnormal findings: Secondary | ICD-10-CM

## 2013-12-20 LAB — BASIC METABOLIC PANEL
BUN: 16 mg/dL (ref 6–23)
CO2: 29 mEq/L (ref 19–32)
Calcium: 9.5 mg/dL (ref 8.4–10.5)
Chloride: 102 mEq/L (ref 96–112)
Creatinine, Ser: 0.8 mg/dL (ref 0.4–1.2)
GFR: 79.31 mL/min (ref >60.00)
Glucose, Bld: 82 mg/dL (ref 70–99)
Potassium: 4.7 mEq/L (ref 3.5–5.1)
Sodium: 137 mEq/L (ref 135–145)

## 2013-12-20 LAB — POCT URINALYSIS DIPSTICK
Bilirubin, UA: NEGATIVE
Blood, UA: NEGATIVE
Glucose, UA: NEGATIVE
Ketones, UA: NEGATIVE
Leukocytes, UA: NEGATIVE
Nitrite, UA: NEGATIVE
Protein, UA: NEGATIVE
Spec Grav, UA: 1.015
Urobilinogen, UA: 0.2
pH, UA: 5.5

## 2013-12-20 LAB — HEPATIC FUNCTION PANEL
ALT: 16 U/L (ref 0–35)
AST: 21 U/L (ref 0–37)
Albumin: 4.5 g/dL (ref 3.5–5.2)
Alkaline Phosphatase: 48 U/L (ref 39–117)
Bilirubin, Direct: 0.1 mg/dL (ref 0.0–0.3)
Total Bilirubin: 0.9 mg/dL (ref 0.3–1.2)
Total Protein: 7.3 g/dL (ref 6.0–8.3)

## 2013-12-20 LAB — LIPID PANEL
Cholesterol: 213 mg/dL — ABNORMAL HIGH (ref 0–200)
HDL: 80.6 mg/dL (ref >39.00)
Total CHOL/HDL Ratio: 3
Triglycerides: 53 mg/dL (ref 0.0–149.0)
VLDL: 10.6 mg/dL (ref 0.0–40.0)

## 2013-12-20 LAB — CBC WITH DIFFERENTIAL/PLATELET
Basophils Absolute: 0 10*3/uL (ref 0.0–0.1)
Basophils Relative: 0.5 % (ref 0.0–3.0)
Eosinophils Absolute: 0 10*3/uL (ref 0.0–0.7)
Eosinophils Relative: 0.9 % (ref 0.0–5.0)
HCT: 43.7 % (ref 36.0–46.0)
Hemoglobin: 15.3 g/dL — ABNORMAL HIGH (ref 12.0–15.0)
Lymphocytes Relative: 26.6 % (ref 12.0–46.0)
Lymphs Abs: 1.4 10*3/uL (ref 0.7–4.0)
MCHC: 35 g/dL (ref 30.0–36.0)
MCV: 92.2 fl (ref 78.0–100.0)
Monocytes Absolute: 0.5 10*3/uL (ref 0.1–1.0)
Monocytes Relative: 9 % (ref 3.0–12.0)
Neutro Abs: 3.4 10*3/uL (ref 1.4–7.7)
Neutrophils Relative %: 63 % (ref 43.0–77.0)
Platelets: 264 10*3/uL (ref 150.0–400.0)
RBC: 4.74 Mil/uL (ref 3.87–5.11)
RDW: 12.9 % (ref 11.5–14.6)
WBC: 5.4 10*3/uL (ref 4.5–10.5)

## 2013-12-20 LAB — TSH: TSH: 1.2 u[IU]/mL (ref 0.35–5.50)

## 2013-12-20 LAB — LDL CHOLESTEROL, DIRECT: Direct LDL: 114.9 mg/dL

## 2013-12-27 ENCOUNTER — Ambulatory Visit (INDEPENDENT_AMBULATORY_CARE_PROVIDER_SITE_OTHER): Payer: Managed Care, Other (non HMO) | Admitting: Internal Medicine

## 2013-12-27 ENCOUNTER — Encounter: Payer: Self-pay | Admitting: Internal Medicine

## 2013-12-27 VITALS — BP 124/78 | HR 116 | Temp 98.1°F | Ht 65.75 in | Wt 127.0 lb

## 2013-12-27 DIAGNOSIS — Z0000 Encounter for general adult medical examination without abnormal findings: Secondary | ICD-10-CM

## 2013-12-27 NOTE — Progress Notes (Signed)
cpx  Past Medical History  Diagnosis Date  . Migraines   . Melanoma   . Melanoma 2010    History   Social History  . Marital Status: Married    Spouse Name: N/A    Number of Children: N/A  . Years of Education: N/A   Occupational History  . Not on file.   Social History Main Topics  . Smoking status: Never Smoker   . Smokeless tobacco: Not on file  . Alcohol Use: 0.6 oz/week    1 Glasses of wine per week  . Drug Use:   . Sexual Activity:    Other Topics Concern  . Not on file   Social History Narrative  . No narrative on file    Past Surgical History  Procedure Laterality Date  . Melanoma excision  2010    back    Family History  Problem Relation Age of Onset  . Cancer Mother     breast, skin  . Hypertension Mother   . Hypertension Father   . Ankylosing spondylitis Father     Allergies  Allergen Reactions  . Celecoxib     REACTION: itching  . Codeine     hallucinations    Current Outpatient Prescriptions on File Prior to Visit  Medication Sig Dispense Refill  . ibuprofen (ADVIL,MOTRIN) 200 MG tablet Take 200 mg by mouth every 6 (six) hours as needed.        . SUMAtriptan (IMITREX) 100 MG tablet Take 100 mg by mouth every 2 (two) hours as needed.         No current facility-administered medications on file prior to visit.     patient denies chest pain, shortness of breath, orthopnea. Denies lower extremity edema, abdominal pain, change in appetite, change in bowel movements. Patient denies rashes, musculoskeletal complaints. No other specific complaints in a complete review of systems.   BP 124/78  Pulse 116  Temp(Src) 98.1 F (36.7 C) (Oral)  Ht 5' 5.75" (1.67 m)  Wt 127 lb (57.607 kg)  BMI 20.66 kg/m2  Well-developed well-nourished female in no acute distress. HEENT exam atraumatic, normocephalic, extraocular muscles are intact. Cerumen - right ear. Neck is supple. No jugular venous distention no thyromegaly. Chest clear to auscultation  without increased work of breathing. Cardiac exam S1 and S2 are regular. Abdominal exam active bowel sounds, soft, nontender. Extremities no edema. Neurologic exam she is alert without any motor sensory deficits. Gait is normal.  Well Visit- health maint UTD

## 2013-12-27 NOTE — Progress Notes (Signed)
Pre visit review using our clinic review tool, if applicable. No additional management support is needed unless otherwise documented below in the visit note. 

## 2014-09-06 ENCOUNTER — Ambulatory Visit (INDEPENDENT_AMBULATORY_CARE_PROVIDER_SITE_OTHER): Payer: Managed Care, Other (non HMO) | Admitting: Family Medicine

## 2014-09-06 DIAGNOSIS — Z23 Encounter for immunization: Secondary | ICD-10-CM

## 2015-01-24 ENCOUNTER — Other Ambulatory Visit (INDEPENDENT_AMBULATORY_CARE_PROVIDER_SITE_OTHER): Payer: Managed Care, Other (non HMO)

## 2015-01-24 DIAGNOSIS — Z0000 Encounter for general adult medical examination without abnormal findings: Secondary | ICD-10-CM

## 2015-01-24 LAB — BASIC METABOLIC PANEL
BUN: 21 mg/dL (ref 6–23)
CO2: 29 meq/L (ref 19–32)
Calcium: 9.9 mg/dL (ref 8.4–10.5)
Chloride: 103 mEq/L (ref 96–112)
Creatinine, Ser: 0.8 mg/dL (ref 0.40–1.20)
GFR: 81.23 mL/min (ref >60.00)
GLUCOSE: 81 mg/dL (ref 70–99)
POTASSIUM: 4.8 meq/L (ref 3.5–5.1)
Sodium: 138 mEq/L (ref 135–145)

## 2015-01-24 LAB — CBC WITH DIFFERENTIAL/PLATELET
BASOS ABS: 0 10*3/uL (ref 0.0–0.1)
Basophils Relative: 0.7 % (ref 0.0–3.0)
EOS ABS: 0 10*3/uL (ref 0.0–0.7)
Eosinophils Relative: 0.9 % (ref 0.0–5.0)
HCT: 44.1 % (ref 36.0–46.0)
Hemoglobin: 15.1 g/dL — ABNORMAL HIGH (ref 12.0–15.0)
Lymphocytes Relative: 30.1 % (ref 12.0–46.0)
Lymphs Abs: 1.5 10*3/uL (ref 0.7–4.0)
MCHC: 34.1 g/dL (ref 30.0–36.0)
MCV: 92.8 fl (ref 78.0–100.0)
MONOS PCT: 7.6 % (ref 3.0–12.0)
Monocytes Absolute: 0.4 10*3/uL (ref 0.1–1.0)
NEUTROS PCT: 60.7 % (ref 43.0–77.0)
Neutro Abs: 3 10*3/uL (ref 1.4–7.7)
Platelets: 272 10*3/uL (ref 150.0–400.0)
RBC: 4.75 Mil/uL (ref 3.87–5.11)
RDW: 12.8 % (ref 11.5–15.5)
WBC: 5 10*3/uL (ref 4.0–10.5)

## 2015-01-24 LAB — LIPID PANEL
Cholesterol: 200 mg/dL (ref 0–200)
HDL: 81.7 mg/dL (ref >39.00)
LDL CALC: 107 mg/dL — AB (ref 0–99)
NonHDL: 118.3
TRIGLYCERIDES: 59 mg/dL (ref 0.0–149.0)
Total CHOL/HDL Ratio: 2
VLDL: 11.8 mg/dL (ref 0.0–40.0)

## 2015-01-24 LAB — POCT URINALYSIS DIP (MANUAL ENTRY)
Glucose, UA: NEGATIVE
Ketones, POC UA: NEGATIVE
Leukocytes, UA: NEGATIVE
Nitrite, UA: NEGATIVE
PROTEIN UA: NEGATIVE
RBC UA: NEGATIVE
SPEC GRAV UA: 1.015
UROBILINOGEN UA: 0.2
pH, UA: 5.5

## 2015-01-24 LAB — HEPATIC FUNCTION PANEL
ALT: 11 U/L (ref 0–35)
AST: 16 U/L (ref 0–37)
Albumin: 4.4 g/dL (ref 3.5–5.2)
Alkaline Phosphatase: 55 U/L (ref 39–117)
BILIRUBIN DIRECT: 0.1 mg/dL (ref 0.0–0.3)
TOTAL PROTEIN: 7.1 g/dL (ref 6.0–8.3)
Total Bilirubin: 0.7 mg/dL (ref 0.2–1.2)

## 2015-01-24 LAB — TSH: TSH: 1.56 u[IU]/mL (ref 0.35–4.50)

## 2015-01-31 ENCOUNTER — Encounter: Payer: Managed Care, Other (non HMO) | Admitting: Family Medicine

## 2015-02-15 ENCOUNTER — Encounter: Payer: Self-pay | Admitting: Family Medicine

## 2015-02-15 ENCOUNTER — Ambulatory Visit (INDEPENDENT_AMBULATORY_CARE_PROVIDER_SITE_OTHER): Payer: Managed Care, Other (non HMO) | Admitting: Family Medicine

## 2015-02-15 VITALS — BP 120/68 | Temp 98.1°F | Ht 66.0 in | Wt 128.0 lb

## 2015-02-15 DIAGNOSIS — Z803 Family history of malignant neoplasm of breast: Secondary | ICD-10-CM | POA: Insufficient documentation

## 2015-02-15 DIAGNOSIS — Z0000 Encounter for general adult medical examination without abnormal findings: Secondary | ICD-10-CM

## 2015-02-15 DIAGNOSIS — E785 Hyperlipidemia, unspecified: Secondary | ICD-10-CM | POA: Insufficient documentation

## 2015-02-15 DIAGNOSIS — J309 Allergic rhinitis, unspecified: Secondary | ICD-10-CM | POA: Insufficient documentation

## 2015-02-15 NOTE — Progress Notes (Signed)
Stacey Reddish, MD Phone: [phone]  Subjective:  Patient presents today to establish care with me as their new primary care provider and for annual physical. Patient was formerly a patient of Dr. Leanne Chang. Chief complaint-noted.   Sees ob/gyn Pap 12/14/2013. Due in April.  Just had mammogram. 45 mom breast cancer. Has been getting mammograms since 63s. 3d, planning for MRI in future.  See dermatology- history in situ melanoma around 2011. Yearly exams. Stacey Martinique, MD.   Exercising and eating well. BMI at goal.   HIV in future. Sexually active only with husband  ROS-Full ROS reviewed and negative except headaches 3-4 x a year controlled with sumaptriptan. No chest pain, shortness of breath, nausea, vomiting, diarrhea, constipation  The following were reviewed and entered/updated in epic: Past Medical History  Diagnosis Date  . Migraines     3-4x a year, worse when on bc  . Melanoma 2010    in situ  . Anxiety state 12/20/2007    Was taking Yaz and had heightened anxiety-had a panic attack. Resolved off of medication. Trialed xanax and beta blocker and did not tolerate well.      Patient Active Problem List   Diagnosis Date Noted  . Hyperlipidemia 02/15/2015    Priority: Medium  . Migraine 07/27/2007    Priority: Medium  . Allergic rhinitis 02/15/2015    Priority: Low  . Labile blood pressure 10/18/2007    Priority: Low   Past Surgical History  Procedure Laterality Date  . Melanoma excision  2010    back  . Wisdom tooth extraction      gen. anesthesia  . Colonoscopy      age 63 blood in stool-colonoscopy reportedly normal    Family History  Problem Relation Age of Onset  . Cancer Mother     breast, skin  . Hypertension Mother   . Hypertension Father     smoking as well  . Ankylosing spondylitis Father     ? complications led to sepsis    Medications- reviewed and updated Current Outpatient Prescriptions  Medication Sig Dispense Refill  . fexofenadine  (ALLEGRA) 180 MG tablet Take 180 mg by mouth daily as needed for allergies or rhinitis.    Marland Kitchen ibuprofen (ADVIL,MOTRIN) 200 MG tablet Take 200 mg by mouth every 6 (six) hours as needed.      . SUMAtriptan (IMITREX) 100 MG tablet Take 100 mg by mouth every 2 (two) hours as needed.       No current facility-administered medications for this visit.    Allergies-reviewed and updated Allergies  Allergen Reactions  . Celecoxib     REACTION: itching  . Codeine     hallucinations    History   Social History  . Marital Status: Married    Spouse Name: N/A  . Number of Children: N/A  . Years of Education: N/A   Social History Main Topics  . Smoking status: Never Smoker   . Smokeless tobacco: Not on file  . Alcohol Use: 0.6 oz/week    1 Glasses of wine per week  . Drug Use: No  . Sexual Activity: Not on file   Other Topics Concern  . None   Social History Narrative   Married (husband a patient of Dr. Yong Channel and mom patient of Dr. Yong Channel). 2 children- Stacey '96 at [facility], Burton Kidney '00 at [place].       Stay at home Mom.    PTA president, a lot of volunteering.  Hobbies: shopping, nails done    ROS--See HPI   Objective: BP 120/68 mmHg  Temp(Src) 98.1 F (36.7 C)  Ht 5\' 6"  (1.676 m)  Wt 128 lb (58.06 kg)  BMI 20.67 kg/m2 Gen: NAD, resting comfortably HEENT: Mucous membranes are moist. Oropharynx normal. Good dentition Neck: no thyromegaly CV: RRR no murmurs rubs or gallops Breast exam through GYN Lungs: CTAB no crackles, wheeze, rhonchi Abdomen: soft/nontender/nondistended/normal bowel sounds. No rebound or guarding.  GYN exam through gyn Ext: no edema, 2+ PT pulses Skin: warm, dry, no rash Neuro: grossly normal, moves all extremities, PERRLA   Assessment/Plan:  49 y.o. female presenting for annual physical.  Health Maintenance counseling: 1. Anticipatory guidance: Patient counseled regarding regular dental exams, wearing seatbelts, wear sunscreen.  2. Risk  factor reduction:  Advised patient of need for regular exercise (walks or intervals 5 days a week) and diet rich and fruits and vegetables to reduce risk of heart attack and stroke.  3. Immunizations/screenings/ancillary studies- up to date, except HIV and planned next bloodwork  Migraines 3-4 x a year controlled with sumatriptan.  LDL 100-110, great HDL. No statin advised.  BP white coat element-improved on recheck  1 year follow up

## 2015-02-15 NOTE — Patient Instructions (Addendum)
Sign release of information at the front desk for the breast center to have them send [place] your mammogram  I think everything looks great today!   I would not recommend losing any weight but would recommend you continue your healthy habits. Probably try to maintain weight around 130. Ok to lift and gain a few lbs from muscle.   With your next physical, let's consider adding HIV for national screening recommendations

## 2015-08-27 ENCOUNTER — Ambulatory Visit (INDEPENDENT_AMBULATORY_CARE_PROVIDER_SITE_OTHER): Payer: Managed Care, Other (non HMO) | Admitting: Family Medicine

## 2015-08-27 DIAGNOSIS — Z23 Encounter for immunization: Secondary | ICD-10-CM | POA: Diagnosis not present

## 2016-02-13 ENCOUNTER — Other Ambulatory Visit (INDEPENDENT_AMBULATORY_CARE_PROVIDER_SITE_OTHER): Payer: Managed Care, Other (non HMO)

## 2016-02-13 DIAGNOSIS — R319 Hematuria, unspecified: Secondary | ICD-10-CM | POA: Diagnosis not present

## 2016-02-13 DIAGNOSIS — Z0000 Encounter for general adult medical examination without abnormal findings: Secondary | ICD-10-CM | POA: Diagnosis not present

## 2016-02-13 LAB — LIPID PANEL
CHOL/HDL RATIO: 2
Cholesterol: 217 mg/dL — ABNORMAL HIGH (ref 0–200)
HDL: 89.3 mg/dL (ref >39.00)
LDL Cholesterol: 115 mg/dL — ABNORMAL HIGH (ref 0–99)
NONHDL: 127.46
Triglycerides: 61 mg/dL (ref 0.0–149.0)
VLDL: 12.2 mg/dL (ref 0.0–40.0)

## 2016-02-13 LAB — HEPATIC FUNCTION PANEL
ALT: 12 U/L (ref 0–35)
AST: 18 U/L (ref 0–37)
Albumin: 4.7 g/dL (ref 3.5–5.2)
Alkaline Phosphatase: 51 U/L (ref 39–117)
BILIRUBIN DIRECT: 0.1 mg/dL (ref 0.0–0.3)
TOTAL PROTEIN: 6.9 g/dL (ref 6.0–8.3)
Total Bilirubin: 0.8 mg/dL (ref 0.2–1.2)

## 2016-02-13 LAB — BASIC METABOLIC PANEL
BUN: 14 mg/dL (ref 6–23)
CALCIUM: 9.8 mg/dL (ref 8.4–10.5)
CO2: 30 meq/L (ref 19–32)
CREATININE: 0.79 mg/dL (ref 0.40–1.20)
Chloride: 101 mEq/L (ref 96–112)
GFR: 82.05 mL/min (ref >60.00)
Glucose, Bld: 87 mg/dL (ref 70–99)
Potassium: 4.3 mEq/L (ref 3.5–5.1)
SODIUM: 137 meq/L (ref 135–145)

## 2016-02-13 LAB — POC URINALSYSI DIPSTICK (AUTOMATED)
BILIRUBIN UA: NEGATIVE
Glucose, UA: NEGATIVE
KETONES UA: NEGATIVE
Leukocytes, UA: NEGATIVE
Nitrite, UA: NEGATIVE
PROTEIN UA: NEGATIVE
SPEC GRAV UA: 1.02
Urobilinogen, UA: 0.2
pH, UA: 6

## 2016-02-13 LAB — CBC WITH DIFFERENTIAL/PLATELET
BASOS PCT: 0.7 % (ref 0.0–3.0)
Basophils Absolute: 0 10*3/uL (ref 0.0–0.1)
EOS PCT: 0.7 % (ref 0.0–5.0)
Eosinophils Absolute: 0 10*3/uL (ref 0.0–0.7)
HEMATOCRIT: 41.6 % (ref 36.0–46.0)
HEMOGLOBIN: 14.3 g/dL (ref 12.0–15.0)
LYMPHS PCT: 27.4 % (ref 12.0–46.0)
Lymphs Abs: 1.4 10*3/uL (ref 0.7–4.0)
MCHC: 34.4 g/dL (ref 30.0–36.0)
MCV: 92.3 fl (ref 78.0–100.0)
Monocytes Absolute: 0.4 10*3/uL (ref 0.1–1.0)
Monocytes Relative: 8.5 % (ref 3.0–12.0)
Neutro Abs: 3.2 10*3/uL (ref 1.4–7.7)
Neutrophils Relative %: 62.7 % (ref 43.0–77.0)
Platelets: 262 10*3/uL (ref 150.0–400.0)
RBC: 4.51 Mil/uL (ref 3.87–5.11)
RDW: 13 % (ref 11.5–15.5)
WBC: 5.2 10*3/uL (ref 4.0–10.5)

## 2016-02-13 LAB — TSH: TSH: 1.37 u[IU]/mL (ref 0.35–4.50)

## 2016-02-14 LAB — URINALYSIS, MICROSCOPIC ONLY

## 2016-02-18 ENCOUNTER — Ambulatory Visit (INDEPENDENT_AMBULATORY_CARE_PROVIDER_SITE_OTHER): Payer: Managed Care, Other (non HMO) | Admitting: Family Medicine

## 2016-02-18 ENCOUNTER — Encounter: Payer: Self-pay | Admitting: Family Medicine

## 2016-02-18 VITALS — BP 122/72 | HR 88 | Temp 98.1°F | Ht 66.0 in | Wt 127.0 lb

## 2016-02-18 DIAGNOSIS — G43909 Migraine, unspecified, not intractable, without status migrainosus: Secondary | ICD-10-CM | POA: Diagnosis not present

## 2016-02-18 DIAGNOSIS — Z0000 Encounter for general adult medical examination without abnormal findings: Secondary | ICD-10-CM

## 2016-02-18 DIAGNOSIS — Z85820 Personal history of malignant melanoma of skin: Secondary | ICD-10-CM | POA: Insufficient documentation

## 2016-02-18 MED ORDER — SUMATRIPTAN SUCCINATE 100 MG PO TABS: 100.0000 mg | ORAL_TABLET | ORAL | Status: DC | PRN

## 2016-02-18 NOTE — Progress Notes (Signed)
Stacey Reddish, MD Phone: [phone]  Subjective:  Patient presents today for their annual physical. Chief complaint-noted.   See problem oriented charting- ROS- full  review of systems was completed and negative except for: bloating, burning in epigastric area relieved by tums (last in mid January and then last few days). No chest pain or shortness of breath. No headache or blurry vision.   The following were reviewed and entered/updated in epic: Past Medical History  Diagnosis Date  . Migraines     3-4x a year, worse when on bc  . Melanoma ([place]) 2010    in situ. Stacey Burton yearly  . Anxiety state 12/20/2007    Was taking Yaz and had heightened anxiety-had a panic attack. Resolved off of medication. Trialed xanax and beta blocker and did not tolerate well.      Patient Active Problem List   Diagnosis Date Noted  . Hyperlipidemia 02/15/2015    Priority: Medium  . Migraine 07/27/2007    Priority: Medium  . Melanoma ([place])     Priority: Low  . Allergic rhinitis 02/15/2015    Priority: Low  . Family history of breast cancer 02/15/2015    Priority: Low  . Labile blood pressure 10/18/2007    Priority: Low   Past Surgical History  Procedure Laterality Date  . Melanoma excision  2010    back  . Wisdom tooth extraction      gen. anesthesia  . Colonoscopy      age 71 blood in stool-colonoscopy reportedly normal    Family History  Problem Relation Age of Onset  . Cancer Mother     breast, skin  . Hypertension Mother   . Hypertension Father     smoking as well  . Ankylosing spondylitis Father     ? complications led to sepsis    Medications- reviewed and updated Current Outpatient Prescriptions  Medication Sig Dispense Refill  . fexofenadine (ALLEGRA) 180 MG tablet Take 180 mg by mouth daily as needed for allergies or rhinitis. Reported on 02/18/2016    . ibuprofen (ADVIL,MOTRIN) 200 MG tablet Take 200 mg by mouth every 6 (six) hours as needed. Reported on 02/18/2016      . SUMAtriptan (IMITREX) 100 MG tablet Take 1 tablet (100 mg total) by mouth every 2 (two) hours as needed (2 doses max per day). Reported on 02/18/2016 10 tablet 0   No current facility-administered medications for this visit.    Allergies-reviewed and updated Allergies  Allergen Reactions  . Celecoxib     REACTION: itching  . Codeine     hallucinations    Social History   Social History  . Marital Status: Married    Spouse Name: N/A  . Number of Children: N/A  . Years of Education: N/A   Social History Main Topics  . Smoking status: Never Smoker   . Smokeless tobacco: None  . Alcohol Use: 0.6 oz/week    1 Glasses of wine per week  . Drug Use: No  . Sexual Activity: Not Asked   Other Topics Concern  . None   Social History Narrative   Married (husband a patient of Dr. Yong Channel and mom patient of Dr. Yong Channel). 2 children- Stacey Burton '96 at [facility], Stacey Burton '00 at [place].       Stay at home Mom.    PTA president, a lot of volunteering.       Hobbies: shopping, nails done    ROS--See HPI   Objective: BP 122/72 mmHg  Pulse 88  Temp(Src) 98.1 F (36.7 C)  Ht 5\' 6"  (1.676 m)  Wt 127 lb (57.607 kg)  BMI 20.51 kg/m2 Gen: NAD, resting comfortably HEENT: Mucous membranes are moist. Oropharynx normal Neck: no thyromegaly CV: RRR no murmurs rubs or gallops Lungs: CTAB no crackles, wheeze, rhonchi Abdomen: soft/nontender/nondistended/normal bowel sounds. No rebound or guarding.  Ext: no edema Skin: warm, dry Neuro: grossly normal, moves all extremities, PERRLA  Breast and GYN deferred to GYN visit  Assessment/Plan:  50 y.o. female presenting for annual physical.  Health Maintenance counseling: 1. Anticipatory guidance: Patient counseled regarding regular dental exams, eye exams, wearing seatbelts.  2. Risk factor reduction:  Advised patient of need for regular exercise (daily currently including weight bearing) and diet rich and fruits and vegetables to reduce risk  of heart attack and stroke.  3. Immunizations/screenings/ancillary studies Health Maintenance Due  Topic Date Due  . HIV Screening - next year labs 08/24/1981   4. Cervical cancer screening- 12/14/2013 with 3 year repeat 5. Breast cancer screening-  breast exam through GYN and mammogram 03/01/15 in system, repeat February - awaiting records 6. Colon cancer screening - brbpr in 2002 had early colonoscopy- no findings. Advised to start age 77 7. Skin cancer screening- Stacey Burton of [facility] dermatology.   Migraine S: sumatriptan prn. Improving <3x a year.  A/P: may have hormonal component as improving perimenopause. Continue sumatriptan prn   Hyperlipidemia S: 2017 10 year risk 0.6% risk A/P: No rx needed. Great HDL but LDL hair high    Return in about 1 year (around 02/17/2017) for physical. Return precautions advised.   Meds ordered this encounter  Medications  . SUMAtriptan (IMITREX) 100 MG tablet    Sig: Take 1 tablet (100 mg total) by mouth every 2 (two) hours as needed (2 doses max per day). Reported on 02/18/2016    Dispense:  10 tablet    Refill:  0

## 2016-02-18 NOTE — Assessment & Plan Note (Signed)
S: sumatriptan prn. Improving <3x a year.  A/P: may have hormonal component as improving perimenopause. Continue sumatriptan prn

## 2016-02-18 NOTE — Patient Instructions (Addendum)
I think you are doing fantastic. Exercise level is great! Keep up strong work  Refilled sumatriptan though glad you are needing this less  Bad cholesterol hair high but good cholesterol so protective you do not need medicine  Look forward to meeting your son! Tell the family I said hello

## 2016-02-18 NOTE — Assessment & Plan Note (Signed)
S: 2017 10 year risk 0.6% risk A/P: No rx needed. Great HDL but LDL hair high

## 2016-09-30 ENCOUNTER — Ambulatory Visit (INDEPENDENT_AMBULATORY_CARE_PROVIDER_SITE_OTHER): Payer: Managed Care, Other (non HMO)

## 2016-09-30 DIAGNOSIS — Z23 Encounter for immunization: Secondary | ICD-10-CM | POA: Diagnosis not present

## 2017-02-18 ENCOUNTER — Encounter: Payer: Self-pay | Admitting: Family Medicine

## 2017-02-18 ENCOUNTER — Ambulatory Visit (INDEPENDENT_AMBULATORY_CARE_PROVIDER_SITE_OTHER): Payer: Managed Care, Other (non HMO) | Admitting: Family Medicine

## 2017-02-18 VITALS — BP 132/80 | HR 77 | Temp 98.1°F | Ht 66.0 in | Wt 129.3 lb

## 2017-02-18 DIAGNOSIS — J040 Acute laryngitis: Secondary | ICD-10-CM

## 2017-02-18 DIAGNOSIS — B9789 Other viral agents as the cause of diseases classified elsewhere: Secondary | ICD-10-CM | POA: Diagnosis not present

## 2017-02-18 DIAGNOSIS — J069 Acute upper respiratory infection, unspecified: Secondary | ICD-10-CM | POA: Diagnosis not present

## 2017-02-18 MED ORDER — BENZONATATE 100 MG PO CAPS: 100.0000 mg | ORAL_CAPSULE | Freq: Three times a day (TID) | ORAL | 0 refills | Status: DC | PRN

## 2017-02-18 NOTE — Progress Notes (Signed)
Pre visit review using our clinic review tool, if applicable. No additional management support is needed unless otherwise documented below in the visit note. 

## 2017-02-18 NOTE — Progress Notes (Signed)
HPI:  Acute visit for congestion and cough: -started: 4 days ago -symptoms:nasal congestion, sore throat, cough, hoarsness -denies:fever, SOB, NVD, tooth pain, sinus pain, body aches -has tried: sudafed -sick contacts/travel/risks: no reported flu, strep or tick exposure -Hx of: mild seasonal allergies  ROS: See pertinent positives and negatives per HPI.  Past Medical History:  Diagnosis Date  . Anxiety state 12/20/2007   Was taking Yaz and had heightened anxiety-had a panic attack. Resolved off of medication. Trialed xanax and beta blocker and did not tolerate well.     . Melanoma ([place]) 2010   in situ. [name] yearly  . Migraines    3-4x a year, worse when on bc    Past Surgical History:  Procedure Laterality Date  . COLONOSCOPY     age 43 blood in stool-colonoscopy reportedly normal  . MELANOMA EXCISION  2010   back  . WISDOM TOOTH EXTRACTION     gen. anesthesia    Family History  Problem Relation Age of Onset  . Cancer Mother     breast, skin  . Hypertension Mother   . Hypertension Father     smoking as well  . Ankylosing spondylitis Father     ? complications led to sepsis    Social History   Social History  . Marital status: Married    Spouse name: N/A  . Number of children: N/A  . Years of education: N/A   Social History Main Topics  . Smoking status: Never Smoker  . Smokeless tobacco: Never Used  . Alcohol use 0.6 oz/week    1 Glasses of wine per week  . Drug use: No  . Sexual activity: Not Asked   Other Topics Concern  . None   Social History Narrative   Married (husband a patient of Dr. Yong Channel and mom patient of Dr. Yong Channel). 2 children- John '96 at [facility], Stanton Kidney '00 at [place].       Stay at home Mom.    PTA president, a lot of volunteering.       Hobbies: shopping, nails done     Current Outpatient Prescriptions:  .  fexofenadine (ALLEGRA) 180 MG tablet, Take 180 mg by mouth as needed for allergies or rhinitis. Reported on  02/18/2016, Disp: , Rfl:  .  ibuprofen (ADVIL,MOTRIN) 200 MG tablet, Take 200 mg by mouth as needed. Reported on 02/18/2016, Disp: , Rfl:  .  SUMAtriptan (IMITREX) 100 MG tablet, Take 1 tablet (100 mg total) by mouth every 2 (two) hours as needed (2 doses max per day). Reported on 02/18/2016, Disp: 10 tablet, Rfl: 0 .  benzonatate (TESSALON PERLES) 100 MG capsule, Take 1 capsule (100 mg total) by mouth 3 (three) times daily as needed for cough., Disp: 20 capsule, Rfl: 0  EXAM:  Vitals:   02/18/17 1034  BP: 132/80  Pulse: 77  Temp: 98.1 F (36.7 C)    Body mass index is 20.87 kg/m.  GENERAL: vitals reviewed and listed above, alert, oriented, appears well hydrated and in no acute distress  HEENT: atraumatic, conjunttiva clear, no obvious abnormalities on inspection of external nose and ears, normal appearance of ear canals and TMs, clear nasal congestion, mild post oropharyngeal erythema with PND, no tonsillar edema or exudate, no sinus TTP, laryngitis  NECK: no obvious masses on inspection  LUNGS: clear to auscultation bilaterally, no wheezes, rales or rhonchi, good air movement  CV: HRRR, no peripheral edema  MS: moves all extremities without noticeable abnormality  PSYCH: pleasant and cooperative, no obvious depression or anxiety  ASSESSMENT AND PLAN:  Discussed the following assessment and plan:  Laryngitis  Viral upper respiratory tract infection  -given HPI and exam findings today, a serious infection or illness is unlikely. We discussed potential etiologies, with VURI being most likely, and advised supportive care and monitoring. Tessalon for cough. We discussed treatment side effects, likely course, antibiotic misuse, transmission, and signs of developing a serious illness. -of course, we advised to return or notify a doctor immediately if symptoms worsen or persist or new concerns arise. -regarding preventive care, reports she is scheduled for CPE    Patient  Instructions  INSTRUCTIONS FOR UPPER RESPIRATORY INFECTION:  I hope you are feeling better soon! Seek care immediately if worsening, new concerns or you are not improving with treatment.  -stay hydrated  -talk normally - try not to whisper, shout or sing until your voice is back to normal  -nasal saline wash 2-3 times daily (use prepackaged nasal saline or bottled/distilled water if making your own)   -can use AFRIN nasal spray for drainage and nasal congestion - but do NOT use longer then 3-4 days  -can use tylenol (in no history of liver disease) or ibuprofen (if no history of kidney disease, bowel bleeding or significant heart disease) as directed for aches and sorethroat  -in the winter time, using a humidifier at night is helpful (please follow cleaning instructions)  -if you are taking a cough medication - use only as directed, may also try a teaspoon of honey to coat the throat and throat lozenges. I sent the tessalon to the pharmacy for you.  -for sore throat, salt water gargles can help  -follow up if you have fevers, facial pain, tooth pain, difficulty breathing or are worsening or symptoms persist longer then expected  Upper Respiratory Infection, Adult An upper respiratory infection (URI) is also known as the common cold. It is often caused by a type of germ (virus). Colds are easily spread (contagious). You can pass it to others by kissing, coughing, sneezing, or drinking out of the same glass. Usually, you get better in 1 to 3  weeks.  However, the cough can last for even longer. HOME CARE   Only take medicine as told by your doctor. Follow instructions provided above.  Drink enough water and fluids to keep your pee (urine) clear or pale yellow.  Get plenty of rest.  Return to work when your temperature is < 100 for 24 hours or as told by your doctor. You may use a face mask and wash your hands to stop your cold from spreading. GET HELP RIGHT AWAY IF:   After the  first few days, you feel you are getting worse.  You have questions about your medicine.  You have chills, shortness of breath, or red spit (mucus).  You have pain in the face for more then 1-2 days, especially when you bend forward.  You have a fever, puffy (swollen) neck, pain when you swallow, or white spots in the back of your throat.  You have a bad headache, ear pain, sinus pain, or chest pain.  You have a high-pitched whistling sound when you breathe in and out (wheezing).  You cough up blood.  You have sore muscles or a stiff neck. MAKE SURE YOU:   Understand these instructions.  Will watch your condition.  Will get help right away if you are not doing well or get worse. Document Released: 05/18/2008 Document  Revised: 02/22/2012 Document Reviewed: 03/07/2014 [facility] Patient Information 2015 [place], [place]. This information is not intended to replace advice given to you by your health care provider. Make sure you discuss any questions you have with your health care provider.  WE NOW OFFER   [facility] FAST TRACK!!!  SAME DAY Appointments for ACUTE CARE  Such as: Sprains, Injuries, cuts, abrasions, rashes, muscle pain, joint pain, back pain Colds, flu, sore throats, headache, allergies, cough, fever  Ear pain, sinus and eye infections Abdominal pain, nausea, vomiting, diarrhea, upset stomach Animal/insect bites  3 Easy Ways to Schedule: Walk-In Scheduling Call in scheduling Mychart Sign-up: [email or web address]            Colin Benton R., DO

## 2017-02-18 NOTE — Patient Instructions (Signed)
INSTRUCTIONS FOR UPPER RESPIRATORY INFECTION:  I hope you are feeling better soon! Seek care immediately if worsening, new concerns or you are not improving with treatment.  -stay hydrated  -talk normally - try not to whisper, shout or sing until your voice is back to normal  -nasal saline wash 2-3 times daily (use prepackaged nasal saline or bottled/distilled water if making your own)   -can use AFRIN nasal spray for drainage and nasal congestion - but do NOT use longer then 3-4 days  -can use tylenol (in no history of liver disease) or ibuprofen (if no history of kidney disease, bowel bleeding or significant heart disease) as directed for aches and sorethroat  -in the winter time, using a humidifier at night is helpful (please follow cleaning instructions)  -if you are taking a cough medication - use only as directed, may also try a teaspoon of honey to coat the throat and throat lozenges. I sent the tessalon to the pharmacy for you.  -for sore throat, salt water gargles can help  -follow up if you have fevers, facial pain, tooth pain, difficulty breathing or are worsening or symptoms persist longer then expected  Upper Respiratory Infection, Adult An upper respiratory infection (URI) is also known as the common cold. It is often caused by a type of germ (virus). Colds are easily spread (contagious). You can pass it to others by kissing, coughing, sneezing, or drinking out of the same glass. Usually, you get better in 1 to 3  weeks.  However, the cough can last for even longer. HOME CARE   Only take medicine as told by your doctor. Follow instructions provided above.  Drink enough water and fluids to keep your pee (urine) clear or pale yellow.  Get plenty of rest.  Return to work when your temperature is < 100 for 24 hours or as told by your doctor. You may use a face mask and wash your hands to stop your cold from spreading. GET HELP RIGHT AWAY IF:   After the first few days,  you feel you are getting worse.  You have questions about your medicine.  You have chills, shortness of breath, or red spit (mucus).  You have pain in the face for more then 1-2 days, especially when you bend forward.  You have a fever, puffy (swollen) neck, pain when you swallow, or white spots in the back of your throat.  You have a bad headache, ear pain, sinus pain, or chest pain.  You have a high-pitched whistling sound when you breathe in and out (wheezing).  You cough up blood.  You have sore muscles or a stiff neck. MAKE SURE YOU:   Understand these instructions.  Will watch your condition.  Will get help right away if you are not doing well or get worse. Document Released: 05/18/2008 Document Revised: 02/22/2012 Document Reviewed: 03/07/2014 [facility] Patient Information 2015 [place], [place]. This information is not intended to replace advice given to you by your health care provider. Make sure you discuss any questions you have with your health care provider.  WE NOW OFFER   [facility] FAST TRACK!!!  SAME DAY Appointments for ACUTE CARE  Such as: Sprains, Injuries, cuts, abrasions, rashes, muscle pain, joint pain, back pain Colds, flu, sore throats, headache, allergies, cough, fever  Ear pain, sinus and eye infections Abdominal pain, nausea, vomiting, diarrhea, upset stomach Animal/insect bites  3 Easy Ways to Schedule: Walk-In Scheduling Call in scheduling Mychart Sign-up: [email or web address]

## 2017-02-24 ENCOUNTER — Ambulatory Visit (INDEPENDENT_AMBULATORY_CARE_PROVIDER_SITE_OTHER): Payer: Managed Care, Other (non HMO) | Admitting: Family Medicine

## 2017-02-24 ENCOUNTER — Encounter: Payer: Self-pay | Admitting: Family Medicine

## 2017-02-24 VITALS — BP 132/84 | HR 104 | Temp 98.4°F | Ht 66.0 in | Wt 130.0 lb

## 2017-02-24 DIAGNOSIS — J209 Acute bronchitis, unspecified: Secondary | ICD-10-CM | POA: Diagnosis not present

## 2017-02-24 MED ORDER — PREDNISONE 10 MG PO TABS: 10.0000 mg | ORAL_TABLET | Freq: Every day | ORAL | 0 refills | Status: DC

## 2017-02-24 NOTE — Progress Notes (Signed)
PCP: [name], MD  Subjective:  Stacey Burton is a 51 y.o. year old very pleasant female patient who presents with  symptoms including nasal congestion, sore throat, cough, chest congestion with yellow sputum. Sinuses only with clear drainage. Started with sore throat as well and now gone. Has lost her voice at this point. Does have some chest pain with cough and laughing or deep breaths but bilateral and diffuse and no exertional element. No swelling in legs or calf pain - does not have wheeze as well and no SOb -started: 11 days ago, symptoms show no change -previous treatments:  mucinex and tessalon. Does not like tessalon as prefers to cough stuff up if possible. Hallucinations on codeine.  -sick contacts/travel/risks: denies flu exposure.   ROS-denies fever, NVD, tooth pain. Denies significant shortness of breath.   Pertinent Past Medical History-  Patient Active Problem List   Diagnosis Date Noted  . Hyperlipidemia 02/15/2015    Priority: Medium  . Migraine 07/27/2007    Priority: Medium  . Melanoma ([place])     Priority: Low  . Allergic rhinitis 02/15/2015    Priority: Low  . Family history of breast cancer 02/15/2015    Priority: Low  . Labile blood pressure 10/18/2007    Priority: Low    Medications- reviewed  Current Outpatient Prescriptions  Medication Sig Dispense Refill  . benzonatate (TESSALON PERLES) 100 MG capsule Take 1 capsule (100 mg total) by mouth 3 (three) times daily as needed for cough. 20 capsule 0  . fexofenadine (ALLEGRA) 180 MG tablet Take 180 mg by mouth as needed for allergies or rhinitis. Reported on 02/18/2016    . ibuprofen (ADVIL,MOTRIN) 200 MG tablet Take 200 mg by mouth as needed. Reported on 02/18/2016    . SUMAtriptan (IMITREX) 100 MG tablet Take 1 tablet (100 mg total) by mouth every 2 (two) hours as needed (2 doses max per day). Reported on 02/18/2016 10 tablet 0  . predniSONE (DELTASONE) 10 MG tablet Take 1 tablet (10 mg total) by mouth  daily with breakfast. 7 tablet 0   No current facility-administered medications for this visit.     Objective: BP 132/84 (BP Location: Left Arm, Patient Position: Sitting, Cuff Size: Normal)   Pulse (!) 104   Temp 98.4 F (36.9 C) (Oral)   Ht 5\' 6"  (1.676 m)   Wt 130 lb (59 kg)   SpO2 96%   BMI 20.98 kg/m  Gen: NAD, resting comfortably HEENT: Turbinates erythematous, TM normal past cerumen in bottom of canal, pharynx mildly erythematous with no tonsilar exudate or edema, no sinus tenderness CV: RRR no murmurs rubs or gallops. Was not tachycardic on my exam but was in high 90s Lungs: CTAB no crackles, wheeze, rhonchi  Ext: no edema Skin: warm, dry, no rash  Assessment/Plan:  Bronchitis History and exam today are suggestive of viral infection most likely due to bronchitis. Symptomatic treatment with: advil for the chest wall pain.  Discussed prednisone:with history of side effects of high heart rate and jitteriness we opted for low dose 10mg  for 7 days. She may stop early if side effect intolerable. Would like to try codeine with mucinex but has allergy to codeine with hallucinations.   We discussed that we did not find any infection that had higher probability of being bacterial such as pneumonia, strep throat, ear infection, bacterial sinusitis. We discussed signs that bacterial infection may have developed particularly fever or shortness of breath or onset of sinus pressure and reasons for  follow up (symptoms worsen, last past expected time frame, new concerns arise).  Does have sinus congestion but clear and no pressure- discussed if develops pressure would consider antibiotic course   Suspect chest pain from frequent coughing- prednisone may help some with inflammation and reduce this pain. Can also continue to use ibuprofen 400mg  up to every 6 hours. HR high but no calf pain or leg swelling and oxygen normal and no SOB- doubt DVT/PE but she is to follow up if SOB/leg swelling/calf  pain develop  Likely course of 3-6 weeks. Patient is contagious and advised good handwashing and consideration of mask If going to be in public places.   Meds ordered this encounter  Medications  . predniSONE (DELTASONE) 10 MG tablet    Sig: Take 1 tablet (10 mg total) by mouth daily with breakfast.    Dispense:  7 tablet    Refill:  0    [name], MD

## 2017-02-24 NOTE — Progress Notes (Signed)
Pre visit review using our clinic review tool, if applicable. No additional management support is needed unless otherwise documented below in the visit note. 

## 2017-02-24 NOTE — Patient Instructions (Signed)
Bronchitis History and exam today are suggestive of viral infection most likely due to bronchitis. Symptomatic treatment with: advil for the chest wall pain.  Discussed prednisone:with history of side effects of high heart rate and jitteriness we opted for low dose 10mg  for 7 days. She may stop early if side effect intolerable. Would like to try codeine with mucinex but has allergy to codeine with hallucinations.   We discussed that we did not find any infection that had higher probability of being bacterial such as pneumonia, strep throat, ear infection, bacterial sinusitis. We discussed signs that bacterial infection may have developed particularly fever or shortness of breath or onset of sinus pressure and reasons for follow up (symptoms worsen, last past expected time frame, new concerns arise).  Likely course of 3-6 weeks. Patient is contagious and advised good handwashing and consideration of mask If going to be in public places.   Meds ordered this encounter  Medications  . predniSONE (DELTASONE) 10 MG tablet    Sig: Take 1 tablet (10 mg total) by mouth daily with breakfast.    Dispense:  7 tablet    Refill:  0

## 2017-02-25 ENCOUNTER — Encounter: Payer: Self-pay | Admitting: Family Medicine

## 2017-04-01 ENCOUNTER — Other Ambulatory Visit: Payer: Self-pay | Admitting: *Deleted

## 2017-04-01 ENCOUNTER — Encounter: Payer: Self-pay | Admitting: Family Medicine

## 2017-04-01 DIAGNOSIS — E785 Hyperlipidemia, unspecified: Secondary | ICD-10-CM

## 2017-04-01 DIAGNOSIS — Z114 Encounter for screening for human immunodeficiency virus [HIV]: Secondary | ICD-10-CM

## 2017-04-01 DIAGNOSIS — Z0000 Encounter for general adult medical examination without abnormal findings: Secondary | ICD-10-CM

## 2017-04-02 ENCOUNTER — Other Ambulatory Visit (INDEPENDENT_AMBULATORY_CARE_PROVIDER_SITE_OTHER): Payer: Managed Care, Other (non HMO)

## 2017-04-02 DIAGNOSIS — Z114 Encounter for screening for human immunodeficiency virus [HIV]: Secondary | ICD-10-CM

## 2017-04-02 DIAGNOSIS — Z0000 Encounter for general adult medical examination without abnormal findings: Secondary | ICD-10-CM | POA: Diagnosis not present

## 2017-04-02 DIAGNOSIS — E785 Hyperlipidemia, unspecified: Secondary | ICD-10-CM

## 2017-04-02 LAB — CBC WITH DIFFERENTIAL/PLATELET
BASOS PCT: 0.9 % (ref 0.0–3.0)
Basophils Absolute: 0 10*3/uL (ref 0.0–0.1)
EOS ABS: 0.1 10*3/uL (ref 0.0–0.7)
EOS PCT: 1.6 % (ref 0.0–5.0)
HEMATOCRIT: 42.8 % (ref 36.0–46.0)
Hemoglobin: 14.3 g/dL (ref 12.0–15.0)
LYMPHS PCT: 25.4 % (ref 12.0–46.0)
Lymphs Abs: 1.2 10*3/uL (ref 0.7–4.0)
MCHC: 33.5 g/dL (ref 30.0–36.0)
MCV: 94.4 fl (ref 78.0–100.0)
Monocytes Absolute: 0.4 10*3/uL (ref 0.1–1.0)
Monocytes Relative: 8.1 % (ref 3.0–12.0)
Neutro Abs: 2.9 10*3/uL (ref 1.4–7.7)
Neutrophils Relative %: 64 % (ref 43.0–77.0)
PLATELETS: 268 10*3/uL (ref 150.0–400.0)
RBC: 4.54 Mil/uL (ref 3.87–5.11)
RDW: 13.6 % (ref 11.5–15.5)
WBC: 4.6 10*3/uL (ref 4.0–10.5)

## 2017-04-02 LAB — LIPID PANEL
CHOL/HDL RATIO: 2
Cholesterol: 206 mg/dL — ABNORMAL HIGH (ref 0–200)
HDL: 88.8 mg/dL (ref >39.00)
LDL Cholesterol: 106 mg/dL — ABNORMAL HIGH (ref 0–99)
NONHDL: 116.7
Triglycerides: 53 mg/dL (ref 0.0–149.0)
VLDL: 10.6 mg/dL (ref 0.0–40.0)

## 2017-04-02 LAB — POC URINALSYSI DIPSTICK (AUTOMATED)
BILIRUBIN UA: NEGATIVE
Blood, UA: NEGATIVE
Glucose, UA: NEGATIVE
KETONES UA: NEGATIVE
Leukocytes, UA: NEGATIVE
Nitrite, UA: NEGATIVE
PH UA: 6 (ref 5.0–8.0)
PROTEIN UA: NEGATIVE
SPEC GRAV UA: 1.015 (ref 1.010–1.025)
Urobilinogen, UA: 0.2 E.U./dL

## 2017-04-02 LAB — COMPREHENSIVE METABOLIC PANEL
ALBUMIN: 4.5 g/dL (ref 3.5–5.2)
ALT: 9 U/L (ref 0–35)
AST: 16 U/L (ref 0–37)
Alkaline Phosphatase: 72 U/L (ref 39–117)
BUN: 13 mg/dL (ref 6–23)
CHLORIDE: 102 meq/L (ref 96–112)
CO2: 31 meq/L (ref 19–32)
CREATININE: 0.77 mg/dL (ref 0.40–1.20)
Calcium: 9.6 mg/dL (ref 8.4–10.5)
GFR: 84.13 mL/min (ref >60.00)
GLUCOSE: 84 mg/dL (ref 70–99)
POTASSIUM: 4.7 meq/L (ref 3.5–5.1)
SODIUM: 139 meq/L (ref 135–145)
Total Bilirubin: 0.7 mg/dL (ref 0.2–1.2)
Total Protein: 6.7 g/dL (ref 6.0–8.3)

## 2017-04-02 LAB — TSH: TSH: 1.67 u[IU]/mL (ref 0.35–4.50)

## 2017-04-03 LAB — HIV ANTIBODY (ROUTINE TESTING W REFLEX): HIV: NONREACTIVE

## 2017-04-08 ENCOUNTER — Encounter: Payer: Self-pay | Admitting: Family Medicine

## 2017-04-08 ENCOUNTER — Ambulatory Visit (INDEPENDENT_AMBULATORY_CARE_PROVIDER_SITE_OTHER): Payer: Managed Care, Other (non HMO) | Admitting: Family Medicine

## 2017-04-08 VITALS — BP 132/84 | HR 83 | Temp 98.0°F | Ht 66.25 in | Wt 131.8 lb

## 2017-04-08 DIAGNOSIS — Z1211 Encounter for screening for malignant neoplasm of colon: Secondary | ICD-10-CM | POA: Diagnosis not present

## 2017-04-08 DIAGNOSIS — Z0000 Encounter for general adult medical examination without abnormal findings: Secondary | ICD-10-CM

## 2017-04-08 DIAGNOSIS — E7800 Pure hypercholesterolemia, unspecified: Secondary | ICD-10-CM | POA: Diagnosis not present

## 2017-04-08 DIAGNOSIS — G43909 Migraine, unspecified, not intractable, without status migrainosus: Secondary | ICD-10-CM | POA: Diagnosis not present

## 2017-04-08 NOTE — Patient Instructions (Addendum)
We will call you within a week or two about your referral to [facility] for colonscopy. If you do not hear within 3 weeks, give [place] a call.  Tell themyou prefer to do in the fall.   You wanted to wait on shingrix- we can do this in the fall or next year when we have a better feel for supply

## 2017-04-08 NOTE — Progress Notes (Signed)
Pre visit review using our clinic review tool, if applicable. No additional management support is needed unless otherwise documented below in the visit note. 

## 2017-04-08 NOTE — Progress Notes (Signed)
Phone: [phone]  Subjective:  Patient presents today for their annual physical. Chief complaint-noted.   See problem oriented charting- ROS- full  review of systems was completed including No chest pain or shortness of breath. No headache or blurry vision.   The following were reviewed and entered/updated in epic: Past Medical History:  Diagnosis Date  . Anxiety state 12/20/2007   Was taking Yaz and had heightened anxiety-had a panic attack. Resolved off of medication. Trialed xanax and beta blocker and did not tolerate well.     . Melanoma ([place]) 2010   in situ. [name] yearly  . Migraines    3-4x a year, worse when on bc   Patient Active Problem List   Diagnosis Date Noted  . Hyperlipidemia 02/15/2015    Priority: Medium  . History of melanoma     Priority: Low  . Allergic rhinitis 02/15/2015    Priority: Low  . Family history of breast cancer 02/15/2015    Priority: Low  . Labile blood pressure 10/18/2007    Priority: Low  . Migraine 07/27/2007    Priority: Low   Past Surgical History:  Procedure Laterality Date  . COLONOSCOPY     age 6 blood in stool-colonoscopy reportedly normal  . MELANOMA EXCISION  2010   back  . WISDOM TOOTH EXTRACTION     gen. anesthesia    Family History  Problem Relation Age of Onset  . Cancer Mother     breast, skin  . Hypertension Mother   . Hypertension Father     smoking as well  . Ankylosing spondylitis Father     ? complications led to sepsis    Medications- reviewed and updated Current Outpatient Prescriptions  Medication Sig Dispense Refill  . fexofenadine (ALLEGRA) 180 MG tablet Take 180 mg by mouth as needed for allergies or rhinitis. Reported on 02/18/2016    . ibuprofen (ADVIL,MOTRIN) 200 MG tablet Take 200 mg by mouth as needed. Reported on 02/18/2016     No current facility-administered medications for this visit.     Allergies-reviewed and updated Allergies  Allergen Reactions  . Celecoxib     REACTION:  itching  . Codeine     hallucinations    Social History   Social History  . Marital status: Married    Spouse name: N/A  . Number of children: N/A  . Years of education: N/A   Social History Main Topics  . Smoking status: Never Smoker  . Smokeless tobacco: Never Used  . Alcohol use 0.6 oz/week    1 Glasses of wine per week  . Drug use: No  . Sexual activity: Not Asked   Other Topics Concern  . None   Social History Narrative   Married (husband a patient of Dr. Yong Channel and mom patient of Dr. Yong Channel). 2 children- John '96 at [facility], Stanton Kidney '00 at [facility].       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       Hobbies: shopping, nails done    Objective: BP 132/84 (BP Location: Left Arm, Patient Position: Sitting, Cuff Size: Normal)   Pulse 83   Temp 98 F (36.7 C) (Oral)   Ht 5' 6.25" (1.683 m)   Wt 131 lb 12.8 oz (59.8 kg)   SpO2 98%   BMI 21.11 kg/m  Gen: NAD, resting comfortably HEENT: Mucous membranes are moist. Oropharynx normal Neck:  no thyromegaly CV: RRR no murmurs rubs or gallops Lungs: CTAB no crackles, wheeze, rhonchi Abdomen: soft/nontender/nondistended/normal bowel sounds. No rebound or guarding.  Ext: no edema Skin: warm, dry Neuro: grossly normal, moves all extremities, PERRLA  Assessment/Plan:  51 y.o. female presenting for annual physical.  Health Maintenance counseling: 1. Anticipatory guidance: Patient counseled regarding regular dental exams q6 months, eye exams yearly, wearing seatbelts.  2. Risk factor reduction:  Advised patient of need for regular exercise and diet rich and fruits and vegetables to reduce risk of heart attack and stroke. Exercise- back to daily after illness. Diet-very healthy- balanced.  Wt Readings from Last 3 Encounters:  04/08/17 131 lb 12.8 oz (59.8 kg)  02/24/17 130 lb (59 kg)  02/18/17 129 lb 4.8 oz (58.7 kg)  3.  Immunizations/screenings/ancillary studies Immunization History  Administered Date(s) Administered  . Influenza Split 09/16/2011, 10/04/2012  . Influenza Whole 09/20/2007, 09/25/2008, 09/11/2009, 09/04/2010  . Influenza,inj,Quad PF,36+ Mos 09/06/2013, 09/06/2014, 08/27/2015, 09/30/2016  . Td 08/02/2009  4. Cervical cancer screening-  with [facility]- need to get records 5. Breast cancer screening-  breast exam through [facility] and mammogram - need to get records - sees next week and will have them fax pap and mammogram report. Family history of breast cancer 6. Colon cancer screening - refer for colonoscopy today. Will wait until the fall 7. Skin cancer screening- sees Dr. Martinique yearly with history of melanoma. Saw just a few weeks ago.   Status of chronic or acute concerns  HLD Lab Results  Component Value Date   CHOL 206 (H) 04/02/2017   HDL 88.80 04/02/2017   LDLCALC 106 (H) 04/02/2017   LDLDIRECT 114.9 12/20/2013   TRIG 53.0 04/02/2017   CHOLHDL 2 04/02/2017   Migraines- prn sumatriptan. About 3 a year in the past- now at least a year since a migraine.   Allergies- allegra some  Return in about 1 year (around 04/08/2018) for physical.  Orders Placed This Encounter  Procedures  . Ambulatory referral to Gastroenterology    Referral Priority:   Routine    Referral Type:   Consultation    Referral Reason:   Specialty Services Required    Number of Visits Requested:   1   Return precautions advised.  [name], MD

## 2017-04-13 LAB — HM PAP SMEAR: HM PAP: NEGATIVE

## 2017-06-01 LAB — HM DEXA SCAN

## 2017-07-19 ENCOUNTER — Encounter: Payer: Self-pay | Admitting: Family Medicine

## 2017-11-15 ENCOUNTER — Encounter: Payer: Self-pay | Admitting: Family Medicine

## 2017-12-15 ENCOUNTER — Encounter: Payer: Self-pay | Admitting: Gastroenterology

## 2018-02-04 LAB — HM MAMMOGRAPHY

## 2018-02-10 ENCOUNTER — Ambulatory Visit (AMBULATORY_SURGERY_CENTER): Payer: Self-pay

## 2018-02-10 VITALS — Ht 65.75 in | Wt 132.6 lb

## 2018-02-10 DIAGNOSIS — Z1211 Encounter for screening for malignant neoplasm of colon: Secondary | ICD-10-CM

## 2018-02-10 MED ORDER — NA SULFATE-K SULFATE-MG SULF 17.5-3.13-1.6 GM/177ML PO SOLN: 1.0000 | Freq: Once | ORAL | 0 refills | Status: AC

## 2018-02-10 NOTE — Progress Notes (Signed)
Per pt, no allergies to soy or egg products.Pt not taking any weight loss meds or using  O2 at home.  Pt refused emmi video. 

## 2018-02-15 ENCOUNTER — Encounter: Payer: Self-pay | Admitting: Gastroenterology

## 2018-02-24 ENCOUNTER — Encounter: Payer: Self-pay | Admitting: Gastroenterology

## 2018-02-24 ENCOUNTER — Ambulatory Visit (AMBULATORY_SURGERY_CENTER): Payer: Managed Care, Other (non HMO) | Admitting: Gastroenterology

## 2018-02-24 ENCOUNTER — Other Ambulatory Visit: Payer: Self-pay

## 2018-02-24 VITALS — BP 124/65 | HR 71 | Temp 97.8°F | Resp 18 | Ht 65.75 in | Wt 132.0 lb

## 2018-02-24 DIAGNOSIS — D123 Benign neoplasm of transverse colon: Secondary | ICD-10-CM | POA: Diagnosis not present

## 2018-02-24 DIAGNOSIS — Z1211 Encounter for screening for malignant neoplasm of colon: Secondary | ICD-10-CM | POA: Diagnosis present

## 2018-02-24 MED ORDER — SODIUM CHLORIDE 0.9 % IV SOLN: 500.0000 mL | Freq: Once | INTRAVENOUS | Status: DC

## 2018-02-24 NOTE — Op Note (Signed)
[place] Patient Name: Stacey Burton Procedure Date: 02/24/2018 10:41 AM MRN: [id number] Endoscopist: Remo Lipps P. Eilish Mcdaniel MD, MD Age: 52 Referring MD:  Date of Birth: 02/16/1966 Gender: Female Account #: [id number] Procedure:                Colonoscopy Indications:              Screening for colorectal malignant neoplasm Medicines:                Monitored Anesthesia Care Procedure:                Pre-Anesthesia Assessment:                           - Prior to the procedure, a History and Physical                            was performed, and patient medications and                            allergies were reviewed. The patient's tolerance of                            previous anesthesia was also reviewed. The risks                            and benefits of the procedure and the sedation                            options and risks were discussed with the patient.                            All questions were answered, and informed consent                            was obtained. Prior Anticoagulants: The patient has                            taken no previous anticoagulant or antiplatelet                            agents. ASA Grade Assessment: I - A normal, healthy                            patient. After reviewing the risks and benefits,                            the patient was deemed in satisfactory condition to                            undergo the procedure.                           After obtaining informed consent, the colonoscope  was passed under direct vision. Throughout the                            procedure, the patient's blood pressure, pulse, and                            oxygen saturations were monitored continuously. The                            Colonoscope was introduced through the anus and                            advanced to the the cecum, identified by                            appendiceal orifice and  ileocecal valve. The                            colonoscopy was performed without difficulty. The                            patient tolerated the procedure well. The quality                            of the bowel preparation was good. The ileocecal                            valve, appendiceal orifice, and rectum were                            photographed. Scope In: 10:49:17 AM Scope Out: 11:08:30 AM Scope Withdrawal Time: 0 hours 16 minutes 0 seconds  Total Procedure Duration: 0 hours 19 minutes 13 seconds  Findings:                 The perianal and digital rectal examinations were                            normal.                           A 3 mm polyp was found in the transverse colon. The                            polyp was sessile. The polyp was removed with a                            cold snare. Resection and retrieval were complete.                           Anal papilla(e) were hypertrophied.                           The exam was otherwise without abnormality on  direct and retroflexion views. Complications:            No immediate complications. Estimated blood loss:                            Minimal. Estimated Blood Loss:     Estimated blood loss was minimal. Impression:               - One 3 mm polyp in the transverse colon, removed                            with a cold snare. Resected and retrieved.                           - Anal papilla(e) were hypertrophied.                           - The examination was otherwise normal on direct                            and retroflexion views. Recommendation:           - Patient has a contact number available for                            emergencies. The signs and symptoms of potential                            delayed complications were discussed with the                            patient. Return to normal activities tomorrow.                            Written discharge instructions were  provided to the                            patient.                           - Resume previous diet.                           - Continue present medications.                           - Await pathology results.                           - Repeat colonoscopy is recommended for                            surveillance. The colonoscopy date will be                            determined after pathology results from today's  exam become available for review. Remo Lipps P. Aneka Fagerstrom MD, MD 02/24/2018 11:12:18 AM This report has been signed electronically.

## 2018-02-24 NOTE — Patient Instructions (Signed)
   INFORMATION ON POLYPS GIVEN TO YOU TODAY  AWAIT PATHOLOGY RESULT ON POLYP REMOVED TODAY   YOU HAD AN ENDOSCOPIC PROCEDURE TODAY AT [facility]:   Refer to the procedure report that was given to you for any specific questions about what was found during the examination.  If the procedure report does not answer your questions, please call your gastroenterologist to clarify.  If you requested that your care partner not be given the details of your procedure findings, then the procedure report has been included in a sealed envelope for you to review at your convenience later.  YOU SHOULD EXPECT: Some feelings of bloating in the abdomen. Passage of more gas than usual.  Walking can help get rid of the air that was put into your GI tract during the procedure and reduce the bloating. If you had a lower endoscopy (such as a colonoscopy or flexible sigmoidoscopy) you may notice spotting of blood in your stool or on the toilet paper. If you underwent a bowel prep for your procedure, you may not have a normal bowel movement for a few days.  Please Note:  You might notice some irritation and congestion in your nose or some drainage.  This is from the oxygen used during your procedure.  There is no need for concern and it should clear up in a day or so.  SYMPTOMS TO REPORT IMMEDIATELY:   Following lower endoscopy (colonoscopy or flexible sigmoidoscopy):  Excessive amounts of blood in the stool  Significant tenderness or worsening of abdominal pains  Swelling of the abdomen that is new, acute  Fever of 100F or higher    For urgent or emergent issues, a gastroenterologist can be reached at any hour by calling [phone].   DIET:  We do recommend a small meal at first, but then you may proceed to your regular diet.  Drink plenty of fluids but you should avoid alcoholic beverages for 24 hours.  ACTIVITY:  You should plan to take it easy for the rest of today and you should NOT  DRIVE or use heavy machinery until tomorrow (because of the sedation medicines used during the test).    FOLLOW UP: Our staff will call the number listed on your records the next business day following your procedure to check on you and address any questions or concerns that you may have regarding the information given to you following your procedure. If we do not reach you, we will leave a message.  However, if you are feeling well and you are not experiencing any problems, there is no need to return our call.  We will assume that you have returned to your regular daily activities without incident.  If any biopsies were taken you will be contacted by phone or by letter within the next 1-3 weeks.  Please call us at [phone] if you have not heard about the biopsies in 3 weeks.    SIGNATURES/CONFIDENTIALITY: You and/or your care partner have signed paperwork which will be entered into your electronic medical record.  These signatures attest to the fact that that the information above on your After Visit Summary has been reviewed and is understood.  Full responsibility of the confidentiality of this discharge information lies with you and/or your care-partner.

## 2018-02-24 NOTE — Progress Notes (Signed)
Pt's states no medical or surgical changes since previsit or office visit. 

## 2018-02-24 NOTE — Progress Notes (Signed)
Report to PACU, RN, vss, BBS= Clear.  

## 2018-02-24 NOTE — Progress Notes (Signed)
Called to room to assist during endoscopic procedure.  Patient ID and intended procedure confirmed with present staff. Received instructions for my participation in the procedure from the performing physician.  

## 2018-02-25 ENCOUNTER — Telehealth: Payer: Self-pay

## 2018-02-25 ENCOUNTER — Telehealth: Payer: Self-pay | Admitting: *Deleted

## 2018-02-25 NOTE — Telephone Encounter (Signed)
  Follow up Call-  Call back number 02/24/2018  Post procedure Call Back phone  # [phone]  Permission to leave phone message Yes  Some recent data might be hidden     Patient questions:  Do you have a fever, pain , or abdominal swelling? No. Pain Score  0 *  Have you tolerated food without any problems? Yes.    Have you been able to return to your normal activities? Yes.    Do you have any questions about your discharge instructions: Diet   No. Medications  No. Follow up visit  No.  Do you have questions or concerns about your Care? No.  Actions: * If pain score is 4 or above: No action needed, pain <4.

## 2018-02-25 NOTE — Telephone Encounter (Signed)
Attempted to reach pt. With follow up call following endoscopic procedure 02/24/18.  LM on pt. Voice mail.  Will try to reach pt. Again later today.

## 2018-03-07 ENCOUNTER — Encounter: Payer: Self-pay | Admitting: Gastroenterology

## 2018-03-08 ENCOUNTER — Encounter: Payer: Self-pay | Admitting: Family Medicine

## 2018-03-08 DIAGNOSIS — Z860101 Personal history of adenomatous and serrated colon polyps: Secondary | ICD-10-CM | POA: Insufficient documentation

## 2018-03-08 DIAGNOSIS — Z86010 Personal history of colonic polyps: Secondary | ICD-10-CM | POA: Insufficient documentation

## 2018-03-16 ENCOUNTER — Encounter: Payer: Self-pay | Admitting: Family Medicine

## 2018-05-25 ENCOUNTER — Encounter: Payer: Managed Care, Other (non HMO) | Admitting: Family Medicine

## 2018-05-25 LAB — HM PAP SMEAR: HM PAP: NEGATIVE

## 2018-05-26 ENCOUNTER — Encounter: Payer: Managed Care, Other (non HMO) | Admitting: Family Medicine

## 2018-07-21 ENCOUNTER — Ambulatory Visit (INDEPENDENT_AMBULATORY_CARE_PROVIDER_SITE_OTHER): Payer: Managed Care, Other (non HMO) | Admitting: Family Medicine

## 2018-07-21 ENCOUNTER — Encounter: Payer: Self-pay | Admitting: Family Medicine

## 2018-07-21 VITALS — BP 116/82 | HR 82 | Temp 97.9°F | Ht 65.75 in | Wt 131.4 lb

## 2018-07-21 DIAGNOSIS — E785 Hyperlipidemia, unspecified: Secondary | ICD-10-CM

## 2018-07-21 DIAGNOSIS — Z0000 Encounter for general adult medical examination without abnormal findings: Secondary | ICD-10-CM

## 2018-07-21 LAB — COMPREHENSIVE METABOLIC PANEL
ALBUMIN: 4.6 g/dL (ref 3.5–5.2)
ALT: 11 U/L (ref 0–35)
AST: 18 U/L (ref 0–37)
Alkaline Phosphatase: 61 U/L (ref 39–117)
BUN: 18 mg/dL (ref 6–23)
CO2: 30 mEq/L (ref 19–32)
CREATININE: 0.78 mg/dL (ref 0.40–1.20)
Calcium: 10.1 mg/dL (ref 8.4–10.5)
Chloride: 103 mEq/L (ref 96–112)
GFR: 82.46 mL/min (ref >60.00)
Glucose, Bld: 92 mg/dL (ref 70–99)
Potassium: 4.5 mEq/L (ref 3.5–5.1)
SODIUM: 138 meq/L (ref 135–145)
TOTAL PROTEIN: 7.5 g/dL (ref 6.0–8.3)
Total Bilirubin: 0.7 mg/dL (ref 0.2–1.2)

## 2018-07-21 LAB — LIPID PANEL
CHOLESTEROL: 220 mg/dL — AB (ref 0–200)
HDL: 93.8 mg/dL (ref >39.00)
LDL Cholesterol: 115 mg/dL — ABNORMAL HIGH (ref 0–99)
NonHDL: 126.15
Total CHOL/HDL Ratio: 2
Triglycerides: 54 mg/dL (ref 0.0–149.0)
VLDL: 10.8 mg/dL (ref 0.0–40.0)

## 2018-07-21 LAB — CBC
HCT: 43.6 % (ref 36.0–46.0)
Hemoglobin: 15 g/dL (ref 12.0–15.0)
MCHC: 34.5 g/dL (ref 30.0–36.0)
MCV: 93.3 fl (ref 78.0–100.0)
Platelets: 253 10*3/uL (ref 150.0–400.0)
RBC: 4.67 Mil/uL (ref 3.87–5.11)
RDW: 13.2 % (ref 11.5–15.5)
WBC: 3.9 10*3/uL — AB (ref 4.0–10.5)

## 2018-07-21 NOTE — Progress Notes (Signed)
Phone: [phone]  Subjective:  Patient presents today for their annual physical. Chief complaint-noted.   See problem oriented charting- ROS- full  review of systems was completed and negative including No chest pain or shortness of breath. No headache or blurry vision.   The following were reviewed and entered/updated in epic: Past Medical History:  Diagnosis Date  . Allergy    seasonal  . Anxiety state 12/20/2007   Was taking Yaz and had heightened anxiety-had a panic attack. Resolved off of medication. Trialed xanax and beta blocker and did not tolerate well.     . Hypertension    no meds/has "white coat syndrome"  . Melanoma ([place]) 2010   in situ. Stacey Burton yearly  . Migraines    3-4x a year, worse when on bc  . Post-operative nausea and vomiting    one time as a child   Patient Active Problem List   Diagnosis Date Noted  . Hyperlipidemia 02/15/2015    Priority: Medium  . History of adenomatous polyp of colon 03/08/2018    Priority: Low  . History of melanoma     Priority: Low  . Allergic rhinitis 02/15/2015    Priority: Low  . Family history of breast cancer 02/15/2015    Priority: Low  . Labile blood pressure 10/18/2007    Priority: Low  . Migraine 07/27/2007    Priority: Low   Past Surgical History:  Procedure Laterality Date  . COLONOSCOPY     age 28 blood in stool-colonoscopy reportedly normal  . MELANOMA EXCISION  2010   back  . WISDOM TOOTH EXTRACTION     gen. anesthesia    Family History  Problem Relation Age of Onset  . Cancer Mother        breast, skin  . Hypertension Mother   . Hypertension Father        smoking as well  . Ankylosing spondylitis Father        ? complications led to sepsis    Medications- reviewed and updated Current Outpatient Medications  Medication Sig Dispense Refill  . fexofenadine (ALLEGRA) 180 MG tablet Take 180 mg by mouth as needed for allergies or rhinitis. Reported on 02/18/2016     No current  facility-administered medications for this visit.     Allergies-reviewed and updated Allergies  Allergen Reactions  . Celecoxib     REACTION: itching with hives  . Codeine     hallucinations    Social History   Social History Narrative   Married (husband a patient of Dr. Yong Burton and mom patient of Dr. Yong Burton). 2 children- Stacey Burton '96 at [employer] [facility], Stacey Burton '00 at [place].       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       Hobbies: shopping, nails done    Objective: BP 116/82 (BP Location: Left Arm, Patient Position: Sitting, Cuff Size: Normal)   Pulse 82   Temp 97.9 F (36.6 C) (Oral)   Ht 5' 5.75" (1.67 m)   Wt 131 lb 6.4 oz (59.6 kg)   LMP 09/10/2016   SpO2 98%   BMI 21.37 kg/m  Gen: NAD, resting comfortably HEENT: Mucous membranes are moist. Oropharynx normal Neck: no thyromegaly CV: RRR no murmurs rubs or gallops Lungs: CTAB no crackles, wheeze, rhonchi Abdomen: soft/nontender/nondistended/normal bowel sounds. No rebound or guarding.  Ext: no edema Skin: warm, dry Neuro: grossly normal, moves all  extremities, PERRLA  Assessment/Plan:  52 y.o. female presenting for annual physical.  Health Maintenance counseling: 1. Anticipatory guidance: Patient counseled regarding regular dental exams -q6 months, eye exams - yearly, wearing seatbelts.  2. Risk factor reduction:  Advised patient of need for regular exercise and diet rich and fruits and vegetables to reduce risk of heart attack and stroke. Exercise- 4 days a week (had been at 6-7). Diet-reasonable diet.  Wt Readings from Last 3 Encounters:  07/21/18 131 lb 6.4 oz (59.6 kg)  02/24/18 132 lb (59.9 kg)  02/10/18 132 lb 9.6 oz (60.1 kg)  3. Immunizations/screenings/ancillary studies Immunization History  Administered Date(s) Administered  . Influenza Split 09/16/2011, 10/04/2012  . Influenza Whole 09/20/2007, 09/25/2008, 09/11/2009,  09/04/2010  . Influenza,inj,Quad PF,6+ Mos 09/06/2013, 09/06/2014, 08/27/2015, 09/30/2016  . Influenza-Unspecified 09/01/2017  . Td 08/02/2009   Health Maintenance Due  Topic Date Due  . INFLUENZA VACCINE - plans for this in the fall 07/14/2018   4. Cervical cancer screening- 04/13/17 last pap with GYN on file here but she recently had one so we may get more updated copy if she had pap 5. Breast cancer screening-  breast exam with GYN and mammogram 02/04/18 6. Colon cancer screening - 02/24/18 with 5 year repeat 7. Skin cancer screening- Dr. Martinique yearly. advised regular sunscreen use. Denies worrisome, changing, or new skin lesions.  8. Birth control/STD check- postmenopausal. Monogamous- no concerns 9. Osteoporosis screening at 51- DEXA with gynecology  Status of chronic or acute concerns  Allegra very sparingly for allergies  Hyperlipidemia- very mild. Excellent HDL and risk extremely low from last years numbers at 0.7%- we will update today  GYN did a UA  Lab/Order associations: Preventative health care - Plan: CBC, Comprehensive metabolic panel, Lipid panel  Hyperlipidemia, unspecified hyperlipidemia type - Plan: CBC, Comprehensive metabolic panel, Lipid panel  Return precautions advised.  Stacey Reddish, MD

## 2018-07-21 NOTE — Patient Instructions (Addendum)
Health Maintenance Due  Topic Date Due  . INFLUENZA VACCINE - Will await until fall 07/14/2018   Please stop by lab before you go  No changes. You are doing fantastic!   12-18 months for physical would be reasonable with how well you are doing

## 2018-08-05 ENCOUNTER — Encounter: Payer: Self-pay | Admitting: Family Medicine

## 2019-11-01 ENCOUNTER — Telehealth: Payer: Self-pay | Admitting: Physical Therapy

## 2019-11-01 ENCOUNTER — Other Ambulatory Visit: Payer: Self-pay

## 2019-11-01 NOTE — Telephone Encounter (Signed)
Copied from [place] [id number]. Topic: General - Other >> Nov 01, 2019  4:01 PM Yvette Rack wrote: Reason for CRM: Pt called back for appt screening. Attempted to transfer pt to the office but there was no answer. Pt requests call back

## 2019-11-02 ENCOUNTER — Ambulatory Visit (INDEPENDENT_AMBULATORY_CARE_PROVIDER_SITE_OTHER): Payer: Managed Care, Other (non HMO) | Admitting: Family Medicine

## 2019-11-02 ENCOUNTER — Encounter: Payer: Self-pay | Admitting: Family Medicine

## 2019-11-02 VITALS — BP 138/82 | HR 88 | Temp 98.0°F | Ht 66.0 in | Wt 140.8 lb

## 2019-11-02 DIAGNOSIS — G43909 Migraine, unspecified, not intractable, without status migrainosus: Secondary | ICD-10-CM | POA: Diagnosis not present

## 2019-11-02 DIAGNOSIS — Z85820 Personal history of malignant melanoma of skin: Secondary | ICD-10-CM | POA: Diagnosis not present

## 2019-11-02 DIAGNOSIS — Z0000 Encounter for general adult medical examination without abnormal findings: Secondary | ICD-10-CM

## 2019-11-02 DIAGNOSIS — M8580 Other specified disorders of bone density and structure, unspecified site: Secondary | ICD-10-CM | POA: Insufficient documentation

## 2019-11-02 DIAGNOSIS — Z803 Family history of malignant neoplasm of breast: Secondary | ICD-10-CM

## 2019-11-02 DIAGNOSIS — E785 Hyperlipidemia, unspecified: Secondary | ICD-10-CM | POA: Diagnosis not present

## 2019-11-02 DIAGNOSIS — Z86010 Personal history of colonic polyps: Secondary | ICD-10-CM

## 2019-11-02 DIAGNOSIS — R0989 Other specified symptoms and signs involving the circulatory and respiratory systems: Secondary | ICD-10-CM

## 2019-11-02 NOTE — Progress Notes (Signed)
Phone: [phone]   Subjective:  Patient presents today for their annual physical. Chief complaint-noted.   See problem oriented charting- Review of Systems  Constitutional: Negative.   HENT: Negative.   Eyes: Negative.   Respiratory: Negative.   Cardiovascular: Negative.   Gastrointestinal: Negative.   Genitourinary: Negative.   Musculoskeletal: Negative.   Skin: Negative.   Neurological: Negative.   Endo/Heme/Allergies: Negative.   Psychiatric/Behavioral: Negative.    The following were reviewed and entered/updated in epic: Past Medical History:  Diagnosis Date  . Allergy    seasonal  . Anxiety state 12/20/2007   Was taking Yaz and had heightened anxiety-had a panic attack. Resolved off of medication. Trialed xanax and beta blocker and did not tolerate well.     . Hypertension    no meds/has "white coat syndrome"  . Melanoma ([place]) 2010   in situ. [name] yearly  . Migraines    3-4x a year, worse when on bc  . Post-operative nausea and vomiting    one time as a child   Patient Active Problem List   Diagnosis Date Noted  . Osteopenia 11/02/2019    Priority: Medium  . Hyperlipidemia 02/15/2015    Priority: Medium  . History of adenomatous polyp of colon 03/08/2018    Priority: Low  . History of melanoma     Priority: Low  . Allergic rhinitis 02/15/2015    Priority: Low  . Family history of breast cancer 02/15/2015    Priority: Low  . Labile blood pressure 10/18/2007    Priority: Low  . Migraine 07/27/2007    Priority: Low   Past Surgical History:  Procedure Laterality Date  . COLONOSCOPY     age 30 blood in stool-colonoscopy reportedly normal  . MELANOMA EXCISION  2010   back  . WISDOM TOOTH EXTRACTION     gen. anesthesia    Family History  Problem Relation Age of Onset  . Cancer Mother        breast, skin  . Hypertension Mother   . Hypertension Father        smoking as well  . Ankylosing spondylitis Father        ? complications led to  sepsis    Medications- reviewed and updated Current Outpatient Medications  Medication Sig Dispense Refill  . fexofenadine (ALLEGRA) 180 MG tablet Take 180 mg by mouth as needed for allergies or rhinitis. Reported on 02/18/2016     No current facility-administered medications for this visit.     Allergies-reviewed and updated Allergies  Allergen Reactions  . Celecoxib     REACTION: itching with hives  . Codeine     hallucinations    Social History   Social History Narrative   Married (husband a patient of Dr. Yong Channel and mom patient of Dr. Yong Channel). 2 children- John '96 at [employer] [facility] [employer] [facility] [employer] [facility], Stanton Kidney '00 at [place].       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       Hobbies: shopping, nails done   Objective  Objective:  BP 138/82   Pulse 88   Temp 98 F (36.7 C) (Temporal)   Ht 5\' 6"  (1.676 m)   Wt 140 lb 12.8 oz (63.9 kg)   LMP 09/10/2016   SpO2 99%   BMI 22.73 kg/m  Gen: NAD, resting comfortably HEENT: Mask not removed due to covid 19. TM normal. Bridge of nose  normal. Eyelids normal.  Neck: no thyromegaly or cervical lymphadenopathy  CV: RRR no murmurs rubs or gallops Lungs: CTAB no crackles, wheeze, rhonchi Abdomen: soft/nontender/nondistended/normal bowel sounds. No rebound or guarding.  Ext: no edema Skin: warm, dry Neuro: grossly normal, moves all extremities, PERRLA   Assessment and Plan   53 y.o. female presenting for annual physical.  Health Maintenance counseling: 1. Anticipatory guidance: Patient counseled regarding regular dental exams q6 months, eye exams-yearly ,  avoiding smoking and second hand smoke , limiting alcohol to 1 beverage per day . Does not drink daily only 2-3 times a week.  2. Risk factor reduction:  Advised patient of need for regular exercise and diet rich and fruits and vegetables to reduce risk of heart attack and stroke. Exercise- five days a week 1 hour. Does  cardio or walking. Diet-Tries to eat healthy diet - some stress eating with covid and wonders about effects of menopause.  Wt Readings from Last 3 Encounters:  11/02/19 140 lb 12.8 oz (63.9 kg)  07/21/18 131 lb 6.4 oz (59.6 kg)  02/24/18 132 lb (59.9 kg)  3. Immunizations/screenings/ancillary studies- wants to hold off on shingrix for now- can do next physical Immunization History  Administered Date(s) Administered  . Influenza Inj Mdck Quad Pf 09/10/2019  . Influenza Split 09/16/2011, 10/04/2012  . Influenza Whole 09/20/2007, 09/25/2008, 09/11/2009, 09/04/2010  . Influenza,inj,Quad PF,6+ Mos 09/06/2013, 09/06/2014, 08/27/2015, 09/30/2016, 09/01/2017  . Influenza-Unspecified 08/14/2013, 09/01/2017, 10/02/2018  . Td 08/02/2009  . Tdap 09/13/2013   4. Cervical cancer screening- last Pap was in July at GYN - 05/25/18 last on file  5. Breast cancer screening-   Family history breast cancer.breast exam monthly  And GYN does yearly and mammogram last one in February . Staggers annual with mammogram q6 months.  6. Colon cancer screening - Up to date due in 2024. 02/24/18 with 5 year repeat.  7. Skin cancer screening- followed by dermatology for history of melanoma. advised regular sunscreen use. Denies worrisome, changing, or new skin lesions.  8. Birth control/STD check- postmenopausal/monogomous 9. Osteoporosis screening at 68- DEXA with GYN- osteopenia noted -never  Smoker. UA with gyn.   Status of chronic or acute concerns   Hyperlipidemia, unspecified hyperlipidemia type- mild hyperlipidemia with last ascvd risk calculation at 1.2%- will update today. Discussed below 7.5% cut off to consider statin.   Migraine without status migrainosus, not intractable, unspecified migraine type- still without migraine in years.   Labile blood pressure- usually around 110 in dentist or GYN- usually gets high with PCP  Osteopenia- followed by GYN - encouraged vitamin D  Recommended follow up: 1 year  physical  Lab/Order associations: last ate around 12:30 - 4 30 at time of labs   ICD-10-CM   1. Preventative health care  Z00.00 CBC with Differential    Comprehensive metabolic panel    Lipid panel  2. Hyperlipidemia, unspecified hyperlipidemia type  E78.5 CBC with Differential    Comprehensive metabolic panel    Lipid panel  3. Migraine without status migrainosus, not intractable, unspecified migraine type  G43.909   4. History of melanoma  Z85.820   5. Labile blood pressure  R09.89   6. History of adenomatous polyp of colon  Z86.010   7. Family history of breast cancer  Z80.3   8. Osteopenia, unspecified location  M85.80    Return precautions advised.  [name], MD

## 2019-11-02 NOTE — Patient Instructions (Addendum)
Great job taking care of your health- no concerns today other than you being less hard on yourself about weight! You are in a very healthy weight range   Goal calcium 1200mg  a day (see handout). Would strongly consider vitamin D at least 800 a day.   Recommended follow up: 1 year physical

## 2019-11-03 LAB — CBC WITH DIFFERENTIAL/PLATELET
Basophils Absolute: 0.1 10*3/uL (ref 0.0–0.1)
Basophils Relative: 1.1 % (ref 0.0–3.0)
Eosinophils Absolute: 0.1 10*3/uL (ref 0.0–0.7)
Eosinophils Relative: 1 % (ref 0.0–5.0)
HCT: 44.2 % (ref 36.0–46.0)
Hemoglobin: 15 g/dL (ref 12.0–15.0)
Lymphocytes Relative: 30 % (ref 12.0–46.0)
Lymphs Abs: 1.7 10*3/uL (ref 0.7–4.0)
MCHC: 33.9 g/dL (ref 30.0–36.0)
MCV: 95.1 fl (ref 78.0–100.0)
Monocytes Absolute: 0.4 10*3/uL (ref 0.1–1.0)
Monocytes Relative: 7.6 % (ref 3.0–12.0)
Neutro Abs: 3.4 10*3/uL (ref 1.4–7.7)
Neutrophils Relative %: 60.3 % (ref 43.0–77.0)
Platelets: 288 10*3/uL (ref 150.0–400.0)
RBC: 4.64 Mil/uL (ref 3.87–5.11)
RDW: 13.2 % (ref 11.5–15.5)
WBC: 5.6 10*3/uL (ref 4.0–10.5)

## 2019-11-03 LAB — LIPID PANEL
Cholesterol: 233 mg/dL — ABNORMAL HIGH (ref 0–200)
HDL: 94 mg/dL (ref >39.00)
LDL Cholesterol: 125 mg/dL — ABNORMAL HIGH (ref 0–99)
NonHDL: 139.24
Total CHOL/HDL Ratio: 2
Triglycerides: 71 mg/dL (ref 0.0–149.0)
VLDL: 14.2 mg/dL (ref 0.0–40.0)

## 2019-11-03 LAB — COMPREHENSIVE METABOLIC PANEL
ALT: 13 U/L (ref 0–35)
AST: 21 U/L (ref 0–37)
Albumin: 4.8 g/dL (ref 3.5–5.2)
Alkaline Phosphatase: 65 U/L (ref 39–117)
BUN: 16 mg/dL (ref 6–23)
CO2: 29 mEq/L (ref 19–32)
Calcium: 9.9 mg/dL (ref 8.4–10.5)
Chloride: 102 mEq/L (ref 96–112)
Creatinine, Ser: 0.89 mg/dL (ref 0.40–1.20)
GFR: 66.3 mL/min (ref >60.00)
Glucose, Bld: 96 mg/dL (ref 70–99)
Potassium: 4.6 mEq/L (ref 3.5–5.1)
Sodium: 139 mEq/L (ref 135–145)
Total Bilirubin: 0.3 mg/dL (ref 0.2–1.2)
Total Protein: 7.5 g/dL (ref 6.0–8.3)

## 2019-11-07 ENCOUNTER — Encounter: Payer: Managed Care, Other (non HMO) | Admitting: Family Medicine

## 2020-02-07 ENCOUNTER — Encounter: Payer: Self-pay | Admitting: Family Medicine

## 2020-07-03 ENCOUNTER — Ambulatory Visit (INDEPENDENT_AMBULATORY_CARE_PROVIDER_SITE_OTHER): Payer: Managed Care, Other (non HMO) | Admitting: Family Medicine

## 2020-07-03 ENCOUNTER — Other Ambulatory Visit: Payer: Self-pay

## 2020-07-03 ENCOUNTER — Encounter: Payer: Self-pay | Admitting: Family Medicine

## 2020-07-03 ENCOUNTER — Telehealth: Payer: Self-pay | Admitting: Family Medicine

## 2020-07-03 VITALS — BP 149/96 | HR 100 | Temp 97.6°F | Ht 66.0 in | Wt 138.4 lb

## 2020-07-03 DIAGNOSIS — M25521 Pain in right elbow: Secondary | ICD-10-CM

## 2020-07-03 NOTE — Progress Notes (Signed)
Patient: Stacey Burton MRN: [id number] DOB: Mar 26, 1966 PCP: Marin Olp, MD     Subjective:  Chief Complaint  Patient presents with  . Joint Swelling    Pt says that she lifts weights. And is unaware if it was due to exercise.    HPI: The patient is a 54 y.o. female who presents today for right elbow pain. She is right handed.  She states she noticed it Monday and denies any trauma. Can not recall hitting it. She woke up Monday morning and she touched it and it was a little sore to the touch. It was swollen and quite bruised. The swelling and bruising have gotten much better.  She really doesn't have any pain. She lifted weights last Thursday and can't recall if did biceps or triceps. she swam on Friday. Cardio and legs on Saturday.  She thinks the swelling is better today. She has full flexion in her elbow joint and full flexion. She can also rotate internally and externally. No redness or warmth to area. No fever/chills.    Review of Systems  Constitutional: Negative for chills and fever.  Musculoskeletal: Positive for joint swelling. Negative for arthralgias and myalgias.  Skin: Positive for color change. Negative for wound.    Allergies Patient is allergic to celecoxib and codeine.  Past Medical History Patient  has a past medical history of Allergy, Anxiety state (12/20/2007), Hypertension, Melanoma ([place]) (2010), Migraines, and Post-operative nausea and vomiting.  Surgical History Patient  has a past surgical history that includes Melanoma excision (2010); Wisdom tooth extraction; and Colonoscopy.  Family History Pateint's family history includes Ankylosing spondylitis in her father; Cancer in her mother; Hypertension in her father and mother.  Social History Patient  reports that she has never smoked. She has never used smokeless tobacco. She reports current alcohol use of about 1.0 standard drink of alcohol per week. She reports that she does not use drugs.     Objective: Vitals:   07/03/20 1120  BP: (!) 149/96  Pulse: 100  Temp: 97.6 F (36.4 C)  TempSrc: Temporal  SpO2: 99%  Weight: 138 lb 6.4 oz (62.8 kg)  Height: 5\' 6"  (1.676 m)    Body mass index is 22.34 kg/m.  Physical Exam Vitals reviewed.  Constitutional:      Appearance: Normal appearance. She is normal weight.  HENT:     Head: Normocephalic and atraumatic.  Musculoskeletal:     Comments: Right olecranon process: mild swelling over this and distally. Bruising surrounding this. No erythema or warmth. Not much ttp. Full flexion, extension. Rotation intact with resistance.   Skin:    Capillary Refill: Capillary refill takes less than 2 seconds.  Neurological:     Mental Status: She is alert.        Assessment/plan: 1. Right elbow pain Bruise vs. Mild bursitis vs. pseudogout although no clinical exam findings consistent with this. Already has improved quite a bit. Will do conservative therapy with voltaren gel QID, ice and can wrap or get dounught pad if needed. She is to call me if any change, redness or worsening symptoms so we can get xrays and labs, but no sign of infection and overall looks to be improving.    This visit occurred during the SARS-CoV-2 public health emergency.  Safety protocols were in place, including screening questions prior to the visit, additional usage of staff PPE, and extensive cleaning of exam room while observing appropriate contact time as indicated for disinfecting solutions.  Return if symptoms worsen or fail to improve.   Orma Flaming, MD [place]   07/03/2020

## 2020-07-03 NOTE — Telephone Encounter (Signed)
Patient has appt today at 11:20am.   Chief Complaint Elbow Injury Reason for Call Symptomatic / Request for Health Information Initial Comment Caller states her elbow is swollen and sore to the touch. It is now turning blue. Translation No Nurse Assessment Nurse: Windle Guard, RN, Olin Hauser Date/Time (Eastern Time): 07/02/2020 2:23:34 PM Confirm and document reason for call. If symptomatic, describe symptoms. ---Caller states her right elbow is swollen and tender to the touch, turning blue. Denies injury. Has the patient had close contact with a person known or suspected to have the novel coronavirus illness OR traveled / lives in area with major community spread (including international travel) in the last 14 days from the onset of symptoms? * If Asymptomatic, screen for exposure and travel within the last 14 days. ---No Does the patient have any new or worsening symptoms? ---Yes Will a triage be completed? ---Yes Related visit to physician within the last 2 weeks? ---No Does the PT have any chronic conditions? (i.e. diabetes, asthma, this includes High risk factors for pregnancy, etc.) ---No Is the patient pregnant or possibly pregnant? (Ask all females between the ages of 41-55) ---No Is this a behavioral health or substance abuse call? ---No Guidelines Guideline Title Affirmed Question Affirmed Notes Nurse Date/Time Eilene Ghazi Time) Elbow Pain Swollen joint Conner, RN, Olin Hauser 07/02/2020 2:26:03 PM Disp. Time Eilene Ghazi Time) Disposition Final User 07/02/2020 2:27:57 PM See PCP within 24 Hours Yes Conner, RN, Olin Hauser

## 2020-07-03 NOTE — Patient Instructions (Signed)
-  start voltaren gel four times a day. Can get this over the counter. Would ice 20 min 3-4 times a day if needed and could do a wrap, but if not hurting and getting better wouldn't worry.   Let me know if any redness, worsening symtpoms we will get xray and labs, but overall looks okay today.

## 2020-07-29 ENCOUNTER — Encounter: Payer: Self-pay | Admitting: Family Medicine

## 2020-07-29 ENCOUNTER — Other Ambulatory Visit: Payer: Self-pay

## 2020-07-29 DIAGNOSIS — M25521 Pain in right elbow: Secondary | ICD-10-CM

## 2021-07-24 ENCOUNTER — Encounter: Payer: Self-pay | Admitting: Family Medicine

## 2021-07-24 ENCOUNTER — Other Ambulatory Visit: Payer: Self-pay

## 2021-07-24 ENCOUNTER — Ambulatory Visit (INDEPENDENT_AMBULATORY_CARE_PROVIDER_SITE_OTHER): Payer: Managed Care, Other (non HMO) | Admitting: Family Medicine

## 2021-07-24 VITALS — BP 119/76 | HR 93 | Temp 97.6°F | Ht 66.0 in | Wt 128.2 lb

## 2021-07-24 DIAGNOSIS — M8580 Other specified disorders of bone density and structure, unspecified site: Secondary | ICD-10-CM | POA: Diagnosis not present

## 2021-07-24 DIAGNOSIS — Z0000 Encounter for general adult medical examination without abnormal findings: Secondary | ICD-10-CM

## 2021-07-24 DIAGNOSIS — E785 Hyperlipidemia, unspecified: Secondary | ICD-10-CM | POA: Diagnosis not present

## 2021-07-24 DIAGNOSIS — J309 Allergic rhinitis, unspecified: Secondary | ICD-10-CM | POA: Diagnosis not present

## 2021-07-24 DIAGNOSIS — Z1159 Encounter for screening for other viral diseases: Secondary | ICD-10-CM

## 2021-07-24 NOTE — Progress Notes (Signed)
Phone [phone]   Subjective:  Patient presents today for their annual physical. Chief complaint-noted.   See problem oriented charting- ROS- full  review of systems was completed and negative per full review of system sheet  The following were reviewed and entered/updated in epic: Past Medical History:  Diagnosis Date   Allergy    seasonal   Anxiety state 12/20/2007   Was taking Yaz and had heightened anxiety-had a panic attack. Resolved off of medication. Trialed xanax and beta blocker and did not tolerate well.      Hypertension    no meds/has "white coat syndrome"   Melanoma ([place]) 2010   in situ. [name] yearly   Migraines    3-4x a year, worse when on bc   Post-operative nausea and vomiting    one time as a child   Patient Active Problem List   Diagnosis Date Noted   Osteopenia 11/02/2019    Priority: Medium   Hyperlipidemia 02/15/2015    Priority: Medium   History of adenomatous polyp of colon 03/08/2018    Priority: Low   History of melanoma     Priority: Low   Allergic rhinitis 02/15/2015    Priority: Low   Family history of breast cancer 02/15/2015    Priority: Low   Labile blood pressure 10/18/2007    Priority: Low   Migraine 07/27/2007    Priority: Low   Past Surgical History:  Procedure Laterality Date   COLONOSCOPY     age 29 blood in stool-colonoscopy reportedly normal   MELANOMA EXCISION  2010   back   [place]     gen. anesthesia    Family History  Problem Relation Age of Onset   Cancer Mother        breast, skin   Hypertension Mother    Hypertension Father        smoking as well   Ankylosing spondylitis Father        ? complications led to sepsis    Medications- reviewed and updated Current Outpatient Medications  Medication Sig Dispense Refill   fexofenadine (ALLEGRA) 180 MG tablet Take 180 mg by mouth as needed for allergies or rhinitis. Reported on 02/18/2016     No current facility-administered medications  for this visit.    Allergies-reviewed and updated Allergies  Allergen Reactions   Celecoxib     REACTION: itching with hives   Codeine     hallucinations    Social History   Social History Narrative   Married (husband a patient of Dr. Yong Channel and mom patient of Dr. Yong Channel). 2 children- John '96 at [facility], Stanton Kidney '00 at [place].       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       Hobbies: shopping, nails done   Objective  Objective:  BP 119/76   Pulse 93   Temp 97.6 F (36.4 C) (Temporal)   Ht '5\' 6"'$  (1.676 m)   Wt 128 lb 3.2 oz (58.2 kg)   LMP 09/10/2016   SpO2 100%   BMI 20.69 kg/m  Gen: NAD, resting comfortably HEENT: Mucous membranes are moist. Oropharynx normal Neck: no thyromegaly CV: RRR no murmurs rubs or gallops Lungs: CTAB no crackles, wheeze, rhonchi Abdomen: soft/nontender/nondistended/normal bowel sounds. No rebound or guarding.  Ext: no edema on right, on left signicant lower extremity edema near site of bee sting- not tender  but warm to touh Skin: warm, dry Neuro: grossly normal, moves all extremities, PERRLA   Assessment and Plan   55 y.o. female presenting for annual physical.  Health Maintenance counseling: 1. Anticipatory guidance: Patient counseled regarding regular dental exams -q6 months, eye exams -yearly,  avoiding smoking and second hand smoke , limiting alcohol to 1 beverage per day- does not drink daily only 2-3 times per week .   2. Risk factor reduction:  Advised patient of need for regular exercise and diet rich and fruits and vegetables to reduce risk of heart attack and stroke. Exercise- 5-7 days a week for 1 hour. walking, swimming laps (walks to pool), some elliptical, some weights . Diet-ties to eat healthy diet  Wt Readings from Last 3 Encounters:  07/24/21 128 lb 3.2 oz (58.2 kg)  07/03/20 138 lb 6.4 oz (62.8 kg)  11/02/19 140 lb 12.8 oz (63.9 kg)  3.  Immunizations/screenings/ancillary studies- discussed Shingrix (declined in last physical)- - team calling pharmacy for dates, Flu vaccination-will let us know if gets outside of clinic, and 4th COVID-19 booster discussed- had covid in may and mild case- plans to wait on current variant specific strain - otherwise up-to-date. -Also discussed hepatitis C screening- opts in  Immunization History  Administered Date(s) Administered   Influenza Inj Mdck Quad Pf 09/10/2019   Influenza Split 09/16/2011, 10/04/2012   Influenza Whole 09/20/2007, 09/25/2008, 09/11/2009, 09/04/2010   Influenza,inj,Quad PF,6+ Mos 09/06/2013, 09/06/2014, 08/27/2015, 09/30/2016, 09/01/2017   Influenza-Unspecified 08/14/2013, 09/01/2017, 10/02/2018, 10/02/2020   Moderna SARS-COV2 Booster Vaccination 11/26/2020   Moderna Sars-Covid-2 Vaccination 02/22/2020, 03/23/2020   Td 08/02/2009   Tdap 09/13/2013   4. Cervical cancer screening- last 05/25/2018 at GYN  (2 weeks ago done as well)with a 3-year repeat planned-we will request records from GYN 5. Breast cancer screening-  Family history of breast cancer. Breast exam monthly and GYN does yearly and mammogram last in  march- we will get records. Staggers annual mammogram q6 months 6. Colon cancer screening - 02/24/2018 with a 5-year repeat planned 7. Skin cancer screening-  followed by dermatology with history of melanoma. advised regular sunscreen use. Denies worrisome, changing, or new skin lesions.  8. Birth control/STD check- postmenopausal/monogamous 9. Osteoporosis screening at 55- DEXA with GYN- osteopenia was noted -vitamin D in 40s when checked with GYN without meds.  -exercising daily with weight bearing -from avs "I do recommend targetting good calcium intake through diet (ideally '1200mg'$ - team please give a handout on calcium in diet)  + vitamin D daily (at least 1000 units per day)  As well as regular weight bearing exercise as able. " -never smoker. UA with  GYN  Status of chronic or acute concerns   #Severe local reaction insect bite-patient with bee sting yesterday on the left medial ankle-continues to have progressive swelling, redness, warmth concerning for severe local reaction.  Encouraged her to ice the area for 15 to 20 minutes a few times a day and to continue her baseline Allegra-could also take Benadryl before bed but if fails to improve and continues to have progressive redness of the leg to call me tomorrow and I can phone in [place] for potential cellulitis. Tdap 2014- declines repeat today  # Allergies S:medication: Allegra 180 mg as needed- took today with bee sting and yesterday A/P: reasonable control with prn use- continue to monitor   #mild hyperlipidemia S: Medication:none - mild hyperlipidemia with last ascvd risk calculation at 1.2% Lab Results  Component Value Date   CHOL 233 (  H) 11/02/2019   HDL 94.00 11/02/2019   LDLCALC 125 (H) 11/02/2019   LDLDIRECT 114.9 12/20/2013   TRIG 71.0 11/02/2019   CHOLHDL 2 11/02/2019   A/P: We will update lipid panel with labs-and recalculate 10-year ASCVD risk  #Labile blood pressure- usually around 110 in dentist or GYN- usually gets high with PCP-still blood pressure looks okay today thankfully   BP Readings from Last 3 Encounters:  07/24/21 119/76  07/03/20 (!) 149/96  11/02/19 138/82    #Osteopenia- followed by GYN - see discussion above  #Migraines without status migrainosus, not intractable, unspecified migraine type- still without migraine in years - continuing to do well today  Recommended follow up: 1 year physical  Lab/Order associations:NOT fasting   ICD-10-CM   1. Preventative health care  Z00.00 CBC with Differential/Platelet    Comprehensive metabolic panel    Lipid panel    Hepatitis C antibody    2. Allergic rhinitis, unspecified seasonality, unspecified trigger  J30.9     3. Hyperlipidemia, unspecified hyperlipidemia type  E78.5 CBC with  Differential/Platelet    Comprehensive metabolic panel    Lipid panel    4. Osteopenia, unspecified location  M85.80     5. Encounter for hepatitis C screening test for low risk patient  Z11.59 Hepatitis C antibody     No orders of the defined types were placed in this encounter.  I,Harris Phan,acting as a Education administrator for [name], MD.,have documented all relevant documentation on the behalf of [name], MD,as directed by  [name], MD while in the presence of [name], MD.  I, [name], MD, have reviewed all documentation for this visit. The documentation on 07/24/21 for the exam, diagnosis, procedures, and orders are all accurate and complete.   Return precautions advised.  [name], MD

## 2021-07-24 NOTE — Patient Instructions (Addendum)
Health Maintenance Due  Topic Date Due   Zoster Vaccines- Shingrix (1 of 2) Calling pharmacy for dates.  Never done   MAMMOGRAM Sign release of information at the check out desk for GYN. 02/05/2020   COVID-19 Vaccine   plans to wait on current variant specific strain 03/27/2021   PAP SMEAR-Modifier Sign release of information at the check out desk for GYN.  05/25/2021   INFLUENZA VACCINE Will get this done at [facility].  07/14/2021   I do recommend targetting good calcium intake through diet (ideally '1200mg'$ - team please give a handout on calcium in diet)  + vitamin D daily (at least 1000 units per day)  As well as regular weight bearing exercise as able.   Please stop by lab before you go If you have mychart- we will send your results within 3 business days of [place] receiving them.  If you do not have mychart- we will call you about results within 5 business days of [place] receiving them.  *please also note that you will see labs on mychart as soon as they post. I will later go in and write notes on them- will say "notes from Dr. Yong Channel"  Recommended follow up: 1 year physical

## 2021-07-25 ENCOUNTER — Telehealth: Payer: Self-pay

## 2021-07-25 LAB — CBC WITH DIFFERENTIAL/PLATELET
Basophils Absolute: 0 10*3/uL (ref 0.0–0.1)
Basophils Relative: 0.7 % (ref 0.0–3.0)
Eosinophils Absolute: 0.1 10*3/uL (ref 0.0–0.7)
Eosinophils Relative: 1.6 % (ref 0.0–5.0)
HCT: 44.3 % (ref 36.0–46.0)
Hemoglobin: 14.7 g/dL (ref 12.0–15.0)
Lymphocytes Relative: 23.7 % (ref 12.0–46.0)
Lymphs Abs: 1.5 10*3/uL (ref 0.7–4.0)
MCHC: 33.3 g/dL (ref 30.0–36.0)
MCV: 94.9 fl (ref 78.0–100.0)
Monocytes Absolute: 0.5 10*3/uL (ref 0.1–1.0)
Monocytes Relative: 8.4 % (ref 3.0–12.0)
Neutro Abs: 4 10*3/uL (ref 1.4–7.7)
Neutrophils Relative %: 65.6 % (ref 43.0–77.0)
Platelets: 264 10*3/uL (ref 150.0–400.0)
RBC: 4.67 Mil/uL (ref 3.87–5.11)
RDW: 13.3 % (ref 11.5–15.5)
WBC: 6.2 10*3/uL (ref 4.0–10.5)

## 2021-07-25 LAB — COMPREHENSIVE METABOLIC PANEL
ALT: 12 U/L (ref 0–35)
AST: 19 U/L (ref 0–37)
Albumin: 4.9 g/dL (ref 3.5–5.2)
Alkaline Phosphatase: 67 U/L (ref 39–117)
BUN: 21 mg/dL (ref 6–23)
CO2: 28 mEq/L (ref 19–32)
Calcium: 9.8 mg/dL (ref 8.4–10.5)
Chloride: 102 mEq/L (ref 96–112)
Creatinine, Ser: 1.09 mg/dL (ref 0.40–1.20)
GFR: 57.43 mL/min — ABNORMAL LOW (ref >60.00)
Glucose, Bld: 80 mg/dL (ref 70–99)
Potassium: 4.3 mEq/L (ref 3.5–5.1)
Sodium: 139 mEq/L (ref 135–145)
Total Bilirubin: 0.5 mg/dL (ref 0.2–1.2)
Total Protein: 7.4 g/dL (ref 6.0–8.3)

## 2021-07-25 LAB — LIPID PANEL
Cholesterol: 240 mg/dL — ABNORMAL HIGH (ref 0–200)
HDL: 98.6 mg/dL (ref >39.00)
LDL Cholesterol: 126 mg/dL — ABNORMAL HIGH (ref 0–99)
NonHDL: 141.36
Total CHOL/HDL Ratio: 2
Triglycerides: 75 mg/dL (ref 0.0–149.0)
VLDL: 15 mg/dL (ref 0.0–40.0)

## 2021-07-25 LAB — HEPATITIS C ANTIBODY
Hepatitis C Ab: NONREACTIVE
SIGNAL TO CUT-OFF: 0.02 (ref <1.00)

## 2021-07-25 MED ORDER — CEPHALEXIN 500 MG PO CAPS: 500.0000 mg | ORAL_CAPSULE | Freq: Three times a day (TID) | ORAL | 0 refills | Status: AC

## 2021-07-25 NOTE — Telephone Encounter (Signed)
From yesterdays OV note:   #Severe local reaction insect bite-patient with bee sting yesterday on the left medial ankle-continues to have progressive swelling, redness, warmth concerning for severe local reaction.  Encouraged her to ice the area for 15 to 20 minutes a few times a day and to continue her baseline Allegra-could also take Benadryl before bed but if fails to improve and continues to have progressive redness of the leg to call me tomorrow and I can phone in [place] for potential cellulitis. Tdap 2014- declines repeat today

## 2021-07-25 NOTE — Telephone Encounter (Signed)
Patient states she spoke with Dr.Hunter yesterday about a bee sting, she states its not any better. Wondering if she can get a prescription sent in.

## 2021-07-25 NOTE — Telephone Encounter (Signed)
Called and made pt aware.

## 2021-07-25 NOTE — Telephone Encounter (Signed)
I sent this in but I still really only want her to take this if she has worsening symptoms such as pain, increasing warmth, expanding redness-can take several days for a severe local reaction to calm back down

## 2021-07-25 NOTE — Addendum Note (Signed)
Addended by: Marin Olp on: 07/25/2021 12:54 PM   Modules accepted: Orders

## 2021-07-26 ENCOUNTER — Encounter: Payer: Self-pay | Admitting: Family Medicine

## 2022-03-30 ENCOUNTER — Ambulatory Visit (INDEPENDENT_AMBULATORY_CARE_PROVIDER_SITE_OTHER): Payer: Managed Care, Other (non HMO) | Admitting: Physician Assistant

## 2022-03-30 ENCOUNTER — Encounter: Payer: Self-pay | Admitting: Physician Assistant

## 2022-03-30 VITALS — BP 130/80 | HR 102 | Temp 98.5°F | Ht 66.0 in | Wt 131.4 lb

## 2022-03-30 DIAGNOSIS — J029 Acute pharyngitis, unspecified: Secondary | ICD-10-CM | POA: Diagnosis not present

## 2022-03-30 MED ORDER — POLYMYXIN B-TRIMETHOPRIM 10000-0.1 UNIT/ML-% OP SOLN: 1.0000 [drp] | OPHTHALMIC | 0 refills | Status: DC

## 2022-03-30 MED ORDER — AMOXICILLIN 500 MG PO CAPS: 500.0000 mg | ORAL_CAPSULE | Freq: Two times a day (BID) | ORAL | 0 refills | Status: AC

## 2022-03-30 NOTE — Progress Notes (Signed)
Stacey Burton is a 56 y.o. female here for a sore throat. ? ?History of Present Illness:  ? ?Chief Complaint  ?Patient presents with  ? Eye Problem  ?  Pt c/o left eye red and itching started yesterday. Woke up eye was pasted shut both days.  ? Sore Throat  ?  Pt c/o sore throat since Thursday, and slight headache, and congestion  ? ?Sore Throat  ?Stacey Burton presents with c/o sore throat that has been onset for 4 days. States that along with this she has also been experiencing a slight headache and congestion which has been worsening as time goes on. When describing her sore throat she states that her entire throat felt swollen although she was able to breathe normally. In an effort to manage her sx, she has been taking advil sporadically and using Emergen-C which has provided minor relief. She also trialed OTC allegra last night, but this provided no relief. Due to sx and the fact that she recently traveled to [place], she took a COVID test but this resulted a negative. Denies fever, chills, or ear pressure/pain.  ? ?Eye Problem ?In addition to her sore throat, pt states that she began experiencing left eye redness and itching yesterday. She also reports that upon waking yesterday and this morning, her left eye was pasted shut. Due to nature of sx, she believes this to be caused by sore throat and congestion in some way but isn't sure. Denies eye pain with movement or blurred vision.  ? ? ?Past Medical History:  ?Diagnosis Date  ? Allergy   ? seasonal  ? Anxiety state 12/20/2007  ? Was taking Yaz and had heightened anxiety-had a panic attack. Resolved off of medication. Trialed xanax and beta blocker and did not tolerate well.     ? Hypertension   ? no meds/has "white coat syndrome"  ? Melanoma ([place]) 2010  ? in situ. [name] yearly  ? Migraines   ? 3-4x a year, worse when on bc  ? Post-operative nausea and vomiting   ? one time as a child  ? ?  ?Social History  ? ?Tobacco Use  ? Smoking status: Never  ? Smokeless  tobacco: Never  ?Substance Use Topics  ? Alcohol use: Yes  ?  Alcohol/week: 1.0 standard drink  ?  Types: 1 Glasses of wine per week  ?  Comment: occasional  ? Drug use: No  ? ? ?Past Surgical History:  ?Procedure Laterality Date  ? COLONOSCOPY    ? age 89 blood in stool-colonoscopy reportedly normal  ? MELANOMA EXCISION  2010  ? back  ? WISDOM TOOTH EXTRACTION    ? gen. anesthesia  ? ? ?Family History  ?Problem Relation Age of Onset  ? Cancer Mother   ?     breast, skin  ? Hypertension Mother   ? Hypertension Father   ?     smoking as well  ? Ankylosing spondylitis Father   ?     ? complications led to sepsis  ? ? ?Allergies  ?Allergen Reactions  ? Celecoxib   ?  REACTION: itching with hives  ? Codeine   ?  hallucinations  ? ? ?Current Medications:  ? ?Current Outpatient Medications:  ?  fexofenadine (ALLEGRA) 180 MG tablet, Take 180 mg by mouth as needed for allergies or rhinitis. Reported on 02/18/2016, Disp: , Rfl:  ?  Ibuprofen (ADVIL) 200 MG CAPS, Take 400 mg by mouth as needed., Disp: , Rfl:   ? ?  Review of Systems:  ? ?ROS ?Negative unless otherwise specified per HPI. ?Vitals:  ? ?Vitals:  ? 03/30/22 0932  ?BP: 130/80  ?Pulse: (!) 102  ?Temp: 98.5 ?F (36.9 ?C)  ?TempSrc: Temporal  ?SpO2: 98%  ?Weight: 131 lb 6.1 oz (59.6 kg)  ?Height: '5\' 6"'$  (1.676 m)  ?   ?Body mass index is 21.21 kg/m?. ? ?Physical Exam:  ? ?Physical Exam ?Vitals and nursing note reviewed.  ?Constitutional:   ?   General: She is not in acute distress. ?   Appearance: She is well-developed. She is not ill-appearing or toxic-appearing.  ?HENT:  ?   Head: Normocephalic and atraumatic.  ?   Right Ear: Tympanic membrane, ear canal and external ear normal. Tympanic membrane is not erythematous, retracted or bulging.  ?   Left Ear: Tympanic membrane, ear canal and external ear normal. Tympanic membrane is not erythematous, retracted or bulging.  ?   Nose: Nose normal.  ?   Right Sinus: No maxillary sinus tenderness or frontal sinus tenderness.  ?    Left Sinus: No maxillary sinus tenderness or frontal sinus tenderness.  ?   Mouth/Throat:  ?   Lips: Pink.  ?   Mouth: Mucous membranes are moist.  ?   Pharynx: Oropharynx is clear. Uvula midline. Posterior oropharyngeal erythema present.  ?Eyes:  ?   General: Lids are normal.  ?   Extraocular Movements: Extraocular movements intact.  ?   Conjunctiva/sclera:  ?   Left eye: Left conjunctiva is injected. Exudate present.  ?Neck:  ?   Trachea: Trachea normal.  ?Cardiovascular:  ?   Rate and Rhythm: Normal rate and regular rhythm.  ?   Pulses: Normal pulses.  ?   Heart sounds: Normal heart sounds, S1 normal and S2 normal.  ?Pulmonary:  ?   Effort: Pulmonary effort is normal.  ?   Breath sounds: Normal breath sounds. No decreased breath sounds, wheezing, rhonchi or rales.  ?Lymphadenopathy:  ?   Cervical: No cervical adenopathy.  ?Skin: ?   General: Skin is warm and dry.  ?Neurological:  ?   Mental Status: She is alert.  ?   GCS: GCS eye subscore is 4. GCS verbal subscore is 5. GCS motor subscore is 6.  ?Psychiatric:     ?   Speech: Speech normal.     ?   Behavior: Behavior normal. Behavior is cooperative.  ? ? ?Assessment and Plan:  ? ?Pharyngitis ?No red flags ?Unfortunately we do not have any strep tests, we are going to empirically treat for this ?Start amoxicillin 500 mg twice daily x 10 days  ?Encouraged patient to push more fluids and rest  ?Follow up with PCP if new/worsening symptoms or concerns occur  ? ?Conjunctiva of Left Eye  ?No red flags  ?Start trimethoprim-polymyxin b ophthalmic solution, place 1 drop into eye every 4 hours  ?Advised patient to wash hands after touching face to avoid spread of infection ?Follow up if new/worsening symptoms or concerns occur ? ?I,Havlyn C Ratchford,acting as a scribe for [employer], PA.,have documented all relevant documentation on the behalf of Inda Coke, PA,as directed by  Inda Coke, PA while in the presence of Inda Coke, [place]. ? ?IInda Coke, PA, have reviewed all documentation for this visit. The documentation on 03/30/22 for the exam, diagnosis, procedures, and orders are all accurate and complete. ? ? ?Inda Coke, PA-C ? ?

## 2022-04-01 ENCOUNTER — Encounter: Payer: Self-pay | Admitting: Physician Assistant

## 2022-05-04 ENCOUNTER — Encounter: Payer: Self-pay | Admitting: Family Medicine

## 2022-05-04 ENCOUNTER — Ambulatory Visit (INDEPENDENT_AMBULATORY_CARE_PROVIDER_SITE_OTHER): Payer: Managed Care, Other (non HMO) | Admitting: Family Medicine

## 2022-05-04 VITALS — BP 130/88 | HR 77 | Temp 97.8°F | Ht 66.0 in | Wt 136.1 lb

## 2022-05-04 DIAGNOSIS — H6121 Impacted cerumen, right ear: Secondary | ICD-10-CM | POA: Diagnosis not present

## 2022-05-04 NOTE — Progress Notes (Signed)
Subjective:     Patient ID: Stacey Burton, female    DOB: May 16, 1966, 56 y.o.   MRN: [id number]  Chief Complaint  Patient presents with   Ear Fullness    Right ear clogged     HPI R ear clogged and hard to hear for few days.  Has had similar in past.  Used peroxide and flushed in shower.  No pain No f/c/congestion/ha  Had URI 4/17-amox and ophth soln. Did resolve.   Health Maintenance Due  Topic Date Due   Zoster Vaccines- Shingrix (1 of 2) Never done   MAMMOGRAM  02/05/2020   COVID-19 Vaccine (3 - Booster for Moderna series) 01/21/2021   PAP SMEAR-Modifier  05/25/2021    Past Medical History:  Diagnosis Date   Allergy    seasonal   Anxiety state 12/20/2007   Was taking Yaz and had heightened anxiety-had a panic attack. Resolved off of medication. Trialed xanax and beta blocker and did not tolerate well.      Hypertension    no meds/has "white coat syndrome"   Melanoma ([place]) 2010   in situ. [name] yearly   Migraines    3-4x a year, worse when on bc   Post-operative nausea and vomiting    one time as a child    Past Surgical History:  Procedure Laterality Date   COLONOSCOPY     age 42 blood in stool-colonoscopy reportedly normal   MELANOMA EXCISION  2010   back   [place]     gen. anesthesia    Outpatient Medications Prior to Visit  Medication Sig Dispense Refill   fexofenadine (ALLEGRA) 180 MG tablet Take 180 mg by mouth as needed for allergies or rhinitis. Reported on 02/18/2016 (Patient not taking: Reported on 05/04/2022)     Fluocinolone Acetonide Body 0.01 % OIL fluocinolone 0.01 % scalp oil and shower cap  PLEASE SEE ATTACHED FOR DETAILED DIRECTIONS (Patient not taking: Reported on 05/04/2022)     fluocinonide (LIDEX) 0.05 % external solution SMARTSIG:1 Topical Twice Daily PRN (Patient not taking: Reported on 05/04/2022)     Ibuprofen 200 MG CAPS Take 400 mg by mouth as needed. (Patient not taking: Reported on 05/04/2022)      trimethoprim-polymyxin b (POLYTRIM) ophthalmic solution Place 1 drop into the left eye every 4 (four) hours. 10 mL 0   No facility-administered medications prior to visit.    Allergies  Allergen Reactions   Celecoxib     REACTION: itching with hives   Codeine     hallucinations   ROS neg/noncontributory except as noted HPI/below      Objective:     BP 130/88   Pulse 77   Temp 97.8 F (36.6 C) (Temporal)   Ht '5\' 6"'$  (1.676 m)   Wt 136 lb 2 oz (61.7 kg)   LMP 09/10/2016   SpO2 97%   BMI 21.97 kg/m  Wt Readings from Last 3 Encounters:  05/04/22 136 lb 2 oz (61.7 kg)  03/30/22 131 lb 6.1 oz (59.6 kg)  07/24/21 128 lb 3.2 oz (58.2 kg)    Physical Exam   Gen: WDWN NAD wf HEENT: NCAT, conjunctiva not injected, sclera nonicteric TM WNL L.  R-clogged w/wax, OP moist, no exudates  NECK:  supple, no thyromegaly, no nodes, no carotid bruits CARDIAC: RRR, S1S2+, no murmur. DP 2+B LUNGS: CTAB. No wheezes EXT:  no edema MSK: no gross abnormalities.  NEURO: A&O x3.  CN II-XII intact.  PSYCH: normal mood.  Good eye contact  Procedure-verbal consent obtained.  Irreg R ear w/H2O2 and water.  Mod amt of wax came out.  Still had some deeper wax but w/mult trials to irrig, no more success and canal getting irrit.  Could see TM-normal.  Pt tolerated well and could hear much better.      Assessment & Plan:   Problem List Items Addressed This Visit   None Visit Diagnoses     Impacted cerumen of right ear    -  Primary      Impacted cerumen R ear-irrig-improved but still some wax.  Pt will use H2o2 for 2-3 more days.  Worse, clogged again-fu for irrig.    No orders of the defined types were placed in this encounter.   Wellington Hampshire, MD

## 2022-05-04 NOTE — Patient Instructions (Signed)
It was very nice to see you today!  Continue peroxide for 2-3 more days.  If clogging again, schedule few days for more irrigation.     PLEASE NOTE:  If you had any lab tests please let us know if you have not heard back within a few days. You may see your results on MyChart before we have a chance to review them but we will give you a call once they are reviewed by [place]. If we ordered any referrals today, please let us know if you have not heard from their office within the next week.   Please try these tips to maintain a healthy lifestyle:  Eat most of your calories during the day when you are active. Eliminate processed foods including packaged sweets (pies, cakes, cookies), reduce intake of potatoes, white bread, white pasta, and white rice. Look for whole grain options, oat flour or almond flour.  Each meal should contain half fruits/vegetables, one quarter protein, and one quarter carbs (no bigger than a computer mouse).  Cut down on sweet beverages. This includes juice, soda, and sweet tea. Also watch fruit intake, though this is a healthier sweet option, it still contains natural sugar! Limit to 3 servings daily.  Drink at least 1 glass of water with each meal and aim for at least 8 glasses per day  Exercise at least 150 minutes every week.

## 2022-08-12 ENCOUNTER — Encounter: Payer: Self-pay | Admitting: Family Medicine

## 2022-08-12 ENCOUNTER — Ambulatory Visit (INDEPENDENT_AMBULATORY_CARE_PROVIDER_SITE_OTHER): Payer: Managed Care, Other (non HMO) | Admitting: Family Medicine

## 2022-08-12 VITALS — BP 116/80 | HR 94 | Temp 97.9°F | Ht 65.0 in | Wt 137.2 lb

## 2022-08-12 DIAGNOSIS — E785 Hyperlipidemia, unspecified: Secondary | ICD-10-CM

## 2022-08-12 DIAGNOSIS — Z0000 Encounter for general adult medical examination without abnormal findings: Secondary | ICD-10-CM | POA: Diagnosis not present

## 2022-08-12 LAB — COMPREHENSIVE METABOLIC PANEL
ALT: 15 U/L (ref 0–35)
AST: 25 U/L (ref 0–37)
Albumin: 4.7 g/dL (ref 3.5–5.2)
Alkaline Phosphatase: 55 U/L (ref 39–117)
BUN: 13 mg/dL (ref 6–23)
CO2: 29 mEq/L (ref 19–32)
Calcium: 10.1 mg/dL (ref 8.4–10.5)
Chloride: 101 mEq/L (ref 96–112)
Creatinine, Ser: 0.77 mg/dL (ref 0.40–1.20)
GFR: 86.51 mL/min (ref >60.00)
Glucose, Bld: 83 mg/dL (ref 70–99)
Potassium: 4.2 mEq/L (ref 3.5–5.1)
Sodium: 140 mEq/L (ref 135–145)
Total Bilirubin: 0.6 mg/dL (ref 0.2–1.2)
Total Protein: 7.6 g/dL (ref 6.0–8.3)

## 2022-08-12 LAB — CBC WITH DIFFERENTIAL/PLATELET
Basophils Absolute: 0 10*3/uL (ref 0.0–0.1)
Basophils Relative: 0.9 % (ref 0.0–3.0)
Eosinophils Absolute: 0 10*3/uL (ref 0.0–0.7)
Eosinophils Relative: 0.6 % (ref 0.0–5.0)
HCT: 43.2 % (ref 36.0–46.0)
Hemoglobin: 14.7 g/dL (ref 12.0–15.0)
Lymphocytes Relative: 27.8 % (ref 12.0–46.0)
Lymphs Abs: 1.4 10*3/uL (ref 0.7–4.0)
MCHC: 33.9 g/dL (ref 30.0–36.0)
MCV: 94.1 fl (ref 78.0–100.0)
Monocytes Absolute: 0.4 10*3/uL (ref 0.1–1.0)
Monocytes Relative: 7.6 % (ref 3.0–12.0)
Neutro Abs: 3.1 10*3/uL (ref 1.4–7.7)
Neutrophils Relative %: 63.1 % (ref 43.0–77.0)
Platelets: 260 10*3/uL (ref 150.0–400.0)
RBC: 4.59 Mil/uL (ref 3.87–5.11)
RDW: 13.3 % (ref 11.5–15.5)
WBC: 4.9 10*3/uL (ref 4.0–10.5)

## 2022-08-12 LAB — LIPID PANEL
Cholesterol: 263 mg/dL — ABNORMAL HIGH (ref 0–200)
HDL: 94.4 mg/dL (ref >39.00)
LDL Cholesterol: 157 mg/dL — ABNORMAL HIGH (ref 0–99)
NonHDL: 168.86
Total CHOL/HDL Ratio: 3
Triglycerides: 59 mg/dL (ref 0.0–149.0)
VLDL: 11.8 mg/dL (ref 0.0–40.0)

## 2022-08-12 LAB — TSH: TSH: 1.24 u[IU]/mL (ref 0.35–5.50)

## 2022-08-12 NOTE — Progress Notes (Signed)
Phone [phone]   Subjective:  Patient presents today for their annual physical. Chief complaint-noted.   See problem oriented charting- ROS- full  review of systems was completed and negative except for: intermittent allergies  The following were reviewed and entered/updated in epic: Past Medical History:  Diagnosis Date   Allergy    seasonal   Anxiety state 12/20/2007   Was taking Yaz and had heightened anxiety-had a panic attack. Resolved off of medication. Trialed xanax and beta blocker and did not tolerate well.      Hypertension    no meds/has "white coat syndrome"   Melanoma ([place]) 2010   in situ. [name] yearly   Migraines    3-4x a year, worse when on bc   Post-operative nausea and vomiting    one time as a child   Patient Active Problem List   Diagnosis Date Noted   Osteopenia 11/02/2019    Priority: Medium    Hyperlipidemia 02/15/2015    Priority: Medium    History of adenomatous polyp of colon 03/08/2018    Priority: Low   History of melanoma     Priority: Low   Allergic rhinitis 02/15/2015    Priority: Low   Family history of breast cancer 02/15/2015    Priority: Low   Labile blood pressure 10/18/2007    Priority: Low   Migraine 07/27/2007    Priority: Low   Past Surgical History:  Procedure Laterality Date   COLONOSCOPY     age 71 blood in stool-colonoscopy reportedly normal   MELANOMA EXCISION  2010   back   [place]     gen. anesthesia    Family History  Problem Relation Age of Onset   Cancer Mother        breast, skin   Hypertension Mother    Hypertension Father        smoking as well   Ankylosing spondylitis Father        ? complications led to sepsis    Medications- reviewed and updated Current Outpatient Medications  Medication Sig Dispense Refill   fexofenadine (ALLEGRA) 180 MG tablet Take 180 mg by mouth as needed for allergies or rhinitis. Reported on 02/18/2016     Fluocinolone Acetonide Body 0.01 % OIL       fluocinonide (LIDEX) 0.05 % external solution      Ibuprofen 200 MG CAPS Take 400 mg by mouth as needed.     No current facility-administered medications for this visit.    Allergies-reviewed and updated Allergies  Allergen Reactions   Celecoxib     REACTION: itching with hives   Codeine     hallucinations    Social History   Social History Narrative   Married (husband a patient of Dr. Yong Channel and mom patient of Dr. Yong Channel). 2 children- John '96 at [facility] state, Stanton Kidney '00 at [place].       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       Hobbies: shopping, nails done   Objective  Objective:  BP 116/80 (BP Location: Left Arm, Patient Position: Sitting, Cuff Size: Normal)   Pulse 94   Temp 97.9 F (36.6 C) (Temporal)   Ht '5\' 5"'$  (1.651 m)   Wt 137 lb 4 oz (62.3 kg)   LMP 09/10/2016   SpO2 97%   BMI 22.84 kg/m  Gen: NAD, resting comfortably HEENT: Mucous membranes  are moist. Oropharynx normal Neck: no thyromegaly CV: RRR no murmurs rubs or gallops Lungs: CTAB no crackles, wheeze, rhonchi Abdomen: soft/nontender/nondistended/normal bowel sounds. No rebound or guarding.  Ext: no edema Skin: warm, dry Neuro: grossly normal, moves all extremities, PERRLA   Assessment and Plan   56 y.o. female presenting for annual physical.  Health Maintenance counseling: 1. Anticipatory guidance: Patient counseled regarding regular dental exams -q6 months, eye exams -yearly,  avoiding smoking and second hand smoke , limiting alcohol to 1 beverage per day- 2-3 a week total , no illicit drugs .   2. Risk factor reduction:  Advised patient of need for regular exercise and diet rich and fruits and vegetables to reduce risk of heart attack and stroke.  Exercise- still swimming and weight training still at least 5 days a week.  Diet/weight management-weight gain from last year but healthy weight overall. Reasonably  healthy diet Wt Readings from Last 3 Encounters:  08/12/22 137 lb 4 oz (62.3 kg)  05/04/22 136 lb 2 oz (61.7 kg)  03/30/22 131 lb 6.1 oz (59.6 kg)  3. Immunizations/screenings/ancillary studies- flu shot later in the season , covid - opts out likely for updated covid shot  Immunization History  Administered Date(s) Administered   Influenza Inj Mdck Quad Pf 09/10/2019   Influenza Split 09/16/2011, 10/04/2012   Influenza Whole 09/20/2007, 09/25/2008, 09/11/2009, 09/04/2010   Influenza,inj,Quad PF,6+ Mos 09/06/2013, 09/06/2014, 08/27/2015, 09/30/2016, 09/01/2017   Influenza-Unspecified 08/14/2013, 09/01/2017, 10/02/2018, 10/02/2020   Moderna SARS-COV2 Booster Vaccination 11/26/2020   Moderna Sars-Covid-2 Vaccination 02/22/2020, 03/23/2020   Td 08/02/2009   Tdap 09/13/2013   Zoster Recombinat (Shingrix) 05/06/2020, 08/02/2020  4. Cervical cancer screening-  we need copy of most recent pap-  5. Breast cancer screening- family history breast cancer.  breast exam with GYN plus self exams monthly and mammogram - regular mammograms- we will try to get copy 6. Colon cancer screening - 02/24/18 with 5 year repeat planned 7. Skin cancer screening- sees dermatology. advised regular sunscreen use. Denies worrisome, changing, or new skin lesions.  8. Birth control/STD check-  postmenopause/monogamous  9. Osteoporosis screening at 65- dexa with GYN- osteopenia noted. Vitamin D has been normal and does weight bearing exercise- had advised calcium in diet-  10. Smoking associated screening - never smoker  Status of chronic or acute concerns   #hyperlipidemia S: Medication:none  The 10-year ASCVD risk score (Arnett DK, et al., 2019) is: 1.1% Lab Results  Component Value Date   CHOL 240 (H) 07/24/2021   HDL 98.60 07/24/2021   LDLCALC 126 (H) 07/24/2021   LDLDIRECT 114.9 12/20/2013   TRIG 75.0 07/24/2021   CHOLHDL 2 07/24/2021   A/P: low ascvd risk- update lipid panel and recalculate  #allergies-  reasonable control on allegra '180mg'$  as needed  #osteopenia- see above  #migraines- thankfully no recurrence in years  #does have good muscle mass for age- think could have contributed to slightly low gfr- plus last year had been taking some ibuprofen and mildly dehydrated  Recommended follow up: Return in about 1 year (around 08/13/2023) for physical or sooner if needed.Schedule b4 you leave.  Lab/Order associations: fasting   ICD-10-CM   1. Preventative health care  Z00.00     2. Hyperlipidemia, unspecified hyperlipidemia type  E78.5      No orders of the defined types were placed in this encounter.  Return precautions advised.  [name], MD

## 2022-08-12 NOTE — Patient Instructions (Addendum)
Please stop by lab before you go If you have mychart- we will send your results within 3 business days of [place] receiving them.  If you do not have mychart- we will call you about results within 5 business days of [place] receiving them.  *please also note that you will see labs on mychart as soon as they post. I will later go in and write notes on them- will say "notes from Dr. Yong Channel"   Recommended follow up: Return in about 1 year (around 08/13/2023) for physical or sooner if needed.Schedule b4 you leave.

## 2022-08-13 LAB — URINALYSIS, ROUTINE W REFLEX MICROSCOPIC
Bilirubin Urine: NEGATIVE
Hgb urine dipstick: NEGATIVE
Ketones, ur: NEGATIVE
Leukocytes,Ua: NEGATIVE
Nitrite: NEGATIVE
Specific Gravity, Urine: <=1.005 — AB (ref 1.000–1.030)
Total Protein, Urine: NEGATIVE
Urine Glucose: NEGATIVE
Urobilinogen, UA: 0.2 (ref 0.0–1.0)
pH: 7 (ref 5.0–8.0)

## 2022-09-01 ENCOUNTER — Encounter: Payer: Self-pay | Admitting: Family Medicine

## 2022-09-18 ENCOUNTER — Ambulatory Visit (INDEPENDENT_AMBULATORY_CARE_PROVIDER_SITE_OTHER): Payer: Managed Care, Other (non HMO) | Admitting: *Deleted

## 2022-09-18 DIAGNOSIS — Z23 Encounter for immunization: Secondary | ICD-10-CM

## 2022-10-11 ENCOUNTER — Encounter: Payer: Self-pay | Admitting: Family Medicine

## 2023-02-11 ENCOUNTER — Encounter: Payer: Self-pay | Admitting: Gastroenterology

## 2023-08-12 LAB — HM DEXA SCAN

## 2023-09-15 ENCOUNTER — Telehealth: Payer: Self-pay | Admitting: Family Medicine

## 2023-09-15 DIAGNOSIS — Z0000 Encounter for general adult medical examination without abnormal findings: Secondary | ICD-10-CM

## 2023-09-15 DIAGNOSIS — E785 Hyperlipidemia, unspecified: Secondary | ICD-10-CM

## 2023-09-15 NOTE — Telephone Encounter (Signed)
Patient has been scheduled for lab only visit on 02/17/24 @ 11:30 am.

## 2023-09-15 NOTE — Telephone Encounter (Signed)
Patient is scheduled for CPE with PCP on 02/17/24 @ 2pm. Patient sent message via front office in basket as listed below. Please Advise.    "Hi! I already have an appointment this day at 2:00, and that still works. What I was wondering is now I am bringing my elderly mother, Keturah Barre, in at 11:00 - usually, I take her to lunch after this, but I will be fasting. Would it be possible for me to do my bloodwork just when I am there with her at 11:00? She will be having blood work too haha! Then I will come back at 2:00 as scheduled for my appointment. I was hoping since this was same day it might work. : ) Thank you! "

## 2023-09-15 NOTE — Telephone Encounter (Signed)
Future labs in, ok to schedule on that morning for lab.

## 2024-02-17 ENCOUNTER — Encounter: Payer: Self-pay | Admitting: Family Medicine

## 2024-02-17 ENCOUNTER — Other Ambulatory Visit: Payer: Managed Care, Other (non HMO)

## 2024-02-17 ENCOUNTER — Ambulatory Visit: Payer: Managed Care, Other (non HMO) | Admitting: Family Medicine

## 2024-02-17 VITALS — BP 126/85 | HR 90 | Temp 97.6°F | Ht 65.0 in | Wt 138.0 lb

## 2024-02-17 DIAGNOSIS — Z23 Encounter for immunization: Secondary | ICD-10-CM | POA: Diagnosis not present

## 2024-02-17 DIAGNOSIS — M8580 Other specified disorders of bone density and structure, unspecified site: Secondary | ICD-10-CM | POA: Diagnosis not present

## 2024-02-17 DIAGNOSIS — Z0000 Encounter for general adult medical examination without abnormal findings: Secondary | ICD-10-CM | POA: Diagnosis not present

## 2024-02-17 DIAGNOSIS — E785 Hyperlipidemia, unspecified: Secondary | ICD-10-CM

## 2024-02-17 LAB — CBC WITH DIFFERENTIAL/PLATELET
Basophils Absolute: 0 10*3/uL (ref 0.0–0.1)
Basophils Relative: 0.9 % (ref 0.0–3.0)
Eosinophils Absolute: 0 10*3/uL (ref 0.0–0.7)
Eosinophils Relative: 0.5 % (ref 0.0–5.0)
HCT: 44 % (ref 36.0–46.0)
Hemoglobin: 14.5 g/dL (ref 12.0–15.0)
Lymphocytes Relative: 28.7 % (ref 12.0–46.0)
Lymphs Abs: 1.4 10*3/uL (ref 0.7–4.0)
MCHC: 33 g/dL (ref 30.0–36.0)
MCV: 94.9 fl (ref 78.0–100.0)
Monocytes Absolute: 0.3 10*3/uL (ref 0.1–1.0)
Monocytes Relative: 6.4 % (ref 3.0–12.0)
Neutro Abs: 3.1 10*3/uL (ref 1.4–7.7)
Neutrophils Relative %: 63.5 % (ref 43.0–77.0)
Platelets: 282 10*3/uL (ref 150.0–400.0)
RBC: 4.64 Mil/uL (ref 3.87–5.11)
RDW: 13.4 % (ref 11.5–15.5)
WBC: 4.8 10*3/uL (ref 4.0–10.5)

## 2024-02-17 LAB — COMPREHENSIVE METABOLIC PANEL
ALT: 17 U/L (ref 0–35)
AST: 27 U/L (ref 0–37)
Albumin: 4.9 g/dL (ref 3.5–5.2)
Alkaline Phosphatase: 51 U/L (ref 39–117)
BUN: 18 mg/dL (ref 6–23)
CO2: 29 meq/L (ref 19–32)
Calcium: 10 mg/dL (ref 8.4–10.5)
Chloride: 102 meq/L (ref 96–112)
Creatinine, Ser: 0.69 mg/dL (ref 0.40–1.20)
GFR: 96.3 mL/min (ref >60.00)
Glucose, Bld: 89 mg/dL (ref 70–99)
Potassium: 4.2 meq/L (ref 3.5–5.1)
Sodium: 138 meq/L (ref 135–145)
Total Bilirubin: 0.6 mg/dL (ref 0.2–1.2)
Total Protein: 7.4 g/dL (ref 6.0–8.3)

## 2024-02-17 LAB — LIPID PANEL
Cholesterol: 264 mg/dL — ABNORMAL HIGH (ref 0–200)
HDL: 95 mg/dL (ref >39.00)
LDL Cholesterol: 153 mg/dL — ABNORMAL HIGH (ref 0–99)
NonHDL: 169.01
Total CHOL/HDL Ratio: 3
Triglycerides: 78 mg/dL (ref 0.0–149.0)
VLDL: 15.6 mg/dL (ref 0.0–40.0)

## 2024-02-17 LAB — TSH: TSH: 1.2 u[IU]/mL (ref 0.35–5.50)

## 2024-02-17 NOTE — Progress Notes (Signed)
 Phone [phone]   Subjective:  Patient presents today for their annual physical. Chief complaint-noted.   See problem oriented charting- ROS- full  review of systems was completed and negative except for: poor sleep quality  The following were reviewed and entered/updated in epic: Past Medical History:  Diagnosis Date   Allergy    seasonal   Anxiety state 12/20/2007   Was taking Yaz and had heightened anxiety-had a panic attack. Resolved off of medication. Trialed xanax and beta blocker and did not tolerate well.      Hypertension    no meds/has "white coat syndrome"   Melanoma ([facility]) 2010   in situ. Amy [place] yearly   Migraines    3-4x a year, worse when on bc   Post-operative nausea and vomiting    one time as a child   Patient Active Problem List   Diagnosis Date Noted   Osteopenia 11/02/2019    Priority: Medium    Hyperlipidemia 02/15/2015    Priority: Medium    History of adenomatous polyp of colon 03/08/2018    Priority: Low   History of melanoma     Priority: Low   Allergic rhinitis 02/15/2015    Priority: Low   Family history of breast cancer 02/15/2015    Priority: Low   Labile blood pressure 10/18/2007    Priority: Low   Migraine 07/27/2007    Priority: Low   Past Surgical History:  Procedure Laterality Date   COLONOSCOPY     age 11 blood in stool-colonoscopy reportedly normal   MELANOMA EXCISION  2010   back   WISDOM TOOTH EXTRACTION     gen. anesthesia    Family History  Problem Relation Age of Onset   Cancer Mother        breast, skin   Hypertension Mother    Hypertension Father        smoking as well   Ankylosing spondylitis Father        ? complications led to sepsis    Medications- reviewed and updated Current Outpatient Medications  Medication Sig Dispense Refill   b complex vitamins capsule Take 1 capsule by mouth daily.     fexofenadine (ALLEGRA) 180 MG tablet Take 180 mg by mouth as needed for allergies or rhinitis. Reported  on 02/18/2016     Ibuprofen 200 MG CAPS Take 400 mg by mouth as needed.     MAGNESIUM PO Take by mouth daily.     VITAMIN D, CHOLECALCIFEROL, PO 1 tablet once a day     Fluocinolone Acetonide Body 0.01 % OIL  (Patient not taking: Reported on 02/17/2024)     fluocinonide (LIDEX) 0.05 % external solution  (Patient not taking: Reported on 02/17/2024)     No current facility-administered medications for this visit.    Allergies-reviewed and updated Allergies  Allergen Reactions   Celecoxib     REACTION: itching with hives   Codeine     hallucinations    Social History   Social History Narrative   Married (husband a patient of Dr. Durene Cal and mom patient of Dr. Durene Cal). 2 children- John '96 at [facility] state (engaged in 2025), Corrie Dandy '00 at [employer].       doing admissions counseling   In past- PTA president, a lot of volunteering.       Hobbies: shopping, nails done, time with friends and daughter   Objective  Objective:  BP 126/85   Pulse 90   Temp 97.6 F (36.4 C)  Ht 5\' 5"  (1.651 m)   Wt 138 lb (62.6 kg)   LMP 09/10/2016   SpO2 100%   BMI 22.96 kg/m  Gen: NAD, resting comfortably HEENT: Mucous membranes are moist. Oropharynx normal Neck: no thyromegaly CV: RRR no murmurs rubs or gallops Lungs: CTAB no crackles, wheeze, rhonchi Abdomen: soft/nontender/nondistended/normal bowel sounds. No rebound or guarding.  Ext: no edema Skin: warm, dry Neuro: grossly normal, moves all extremities, PERRLA   Assessment and Plan   58 y.o. female presenting for annual physical.  Health Maintenance counseling: 1. Anticipatory guidance: Patient counseled regarding regular dental exams -q6 months, eye exams - yearly,  avoiding smoking and second hand smoke , limiting alcohol to 1 beverage per day- 2-3 per week , no illicit drugs.   2. Risk factor reduction:  Advised patient of need for regular exercise and diet rich and fruits and vegetables to reduce risk of heart attack and stroke.   Exercise-walks 4 miles most days, less swimming in winter- could go to [place], strength training has been down- plans to restart Diet/weight management-continued healthy overall weight- tries to eat reasonably cclean.  Wt Readings from Last 3 Encounters:  02/17/24 138 lb (62.6 kg)  08/12/22 137 lb 4 oz (62.3 kg)  05/04/22 136 lb 2 oz (61.7 kg)  3. Immunizations/screenings/ancillary studies-Tdap today otherwise up-to-date as holding off on COVID at this point  Immunization History  Administered Date(s) Administered   Influenza Inj Mdck Quad Pf 09/10/2019   Influenza Split 09/16/2011, 10/04/2012   Influenza Whole 09/20/2007, 09/25/2008, 09/11/2009, 09/04/2010   Influenza,inj,Quad PF,6+ Mos 09/06/2013, 09/06/2014, 08/27/2015, 09/30/2016, 09/01/2017, 09/18/2022   Influenza-Unspecified 08/14/2013, 09/01/2017, 10/02/2018, 10/02/2020, 09/29/2023   Moderna SARS-COV2 Booster Vaccination 11/26/2020   Moderna Sars-Covid-2 Vaccination 02/22/2020, 03/23/2020   PFIZER Comirnaty([place])Covid-19 Tri-Sucrose Vaccine 10/11/2022   Td 08/02/2009   Tdap 09/13/2013   Zoster Recombinant(Shingrix) 05/06/2020, 08/02/2020  4. Cervical cancer screening- team requested records today 5. Breast cancer screening-  breast exam with GYN and mammogram -[facility]-asked that she have them send [place] a copy 6. Colon cancer screening - last completed in 2019 and interval was changed to 7 years by [facility]-will be due in 2026  7. Skin cancer screening-sees dermatology yearly. advised regular sunscreen use. Denies worrisome, changing, or new skin lesions.  8. Birth control/STD check- postmenopausal and monogamous  9. Osteoporosis screening at 65-most recent bone density 08/12/2023 with worst T-score -2.1 at left femoral neck but osteopenia noted in all areas around -2. Has maintained 10. Smoking associated screening - never smoker  Status of chronic or acute concerns    #hyperlipidemia S: Medication: None, The 10-year ASCVD risk score (Arnett DK, et al., 2019) is: 1.9%  Lab Results  Component Value Date   CHOL 263 (H) 08/12/2022   HDL 94.40 08/12/2022   LDLCALC 157 (H) 08/12/2022   LDLDIRECT 114.9 12/20/2013   TRIG 59.0 08/12/2022   CHOLHDL 3 08/12/2022   A/P: updated lipids today -has considered metamucil   # Allergies-reasonable control on Allegra as needed   # Osteopenia-see above  # Migraines-no recent recurrence   Recommended follow up: Return in about 1 year (around 02/16/2025) for physical or sooner if needed.Schedule b4 you leave.  Lab/Order associations:labs were already done- fasting   ICD-10-CM   1. Preventative health care  Z00.00     2. Need for Tdap vaccination  Z23 Tdap vaccine greater than or equal to 7yo IM    3. Hyperlipidemia, unspecified hyperlipidemia type  E78.5     4. Osteopenia, unspecified location  M85.80       No orders of the defined types were placed in this encounter.   Return precautions advised.  Tana Conch, MD

## 2024-02-17 NOTE — Patient Instructions (Addendum)
 Tetanus, Diphtheria, and Pertussis (Tdap) today   [place] bear sleep mask  Yuka app  No screens at least 30 minutes before bed  Recommended follow up: Return in about 1 year (around 02/16/2025) for physical or sooner if needed.Schedule b4 you leave.

## 2024-12-19 ENCOUNTER — Encounter: Payer: Self-pay | Admitting: Family Medicine

## 2025-02-22 ENCOUNTER — Encounter: Admitting: Family Medicine
